# Patient Record
Sex: Female | Born: 1970 | Race: White | Hispanic: No | Marital: Married | Smoking: Former smoker
Health system: Southern US, Community
[De-identification: ages and names within clinical notes are randomized; demographics above are authoritative.]

## PROBLEM LIST (undated history)

## (undated) DIAGNOSIS — I471 Supraventricular tachycardia, unspecified: Secondary | ICD-10-CM

## (undated) DIAGNOSIS — Z8669 Personal history of other diseases of the nervous system and sense organs: Secondary | ICD-10-CM

## (undated) DIAGNOSIS — E119 Type 2 diabetes mellitus without complications: Secondary | ICD-10-CM

## (undated) DIAGNOSIS — M1990 Unspecified osteoarthritis, unspecified site: Secondary | ICD-10-CM

## (undated) DIAGNOSIS — T7840XA Allergy, unspecified, initial encounter: Secondary | ICD-10-CM

## (undated) HISTORY — DX: Type 2 diabetes mellitus without complications: E11.9

## (undated) HISTORY — DX: Allergy, unspecified, initial encounter: T78.40XA

## (undated) HISTORY — PX: DENTAL SURGERY: SHX609

## (undated) HISTORY — DX: Supraventricular tachycardia, unspecified: I47.10

## (undated) HISTORY — PX: MOUTH SURGERY: SHX715

---

## 2004-10-12 ENCOUNTER — Other Ambulatory Visit: Admission: RE | Admit: 2004-10-12 | Discharge: 2004-10-12 | Payer: Self-pay | Admitting: Obstetrics and Gynecology

## 2004-10-13 ENCOUNTER — Other Ambulatory Visit: Admission: RE | Admit: 2004-10-13 | Discharge: 2004-10-13 | Payer: Self-pay | Admitting: Obstetrics and Gynecology

## 2005-05-29 ENCOUNTER — Inpatient Hospital Stay (HOSPITAL_COMMUNITY): Admission: AD | Admit: 2005-05-29 | Discharge: 2005-06-01 | Payer: Self-pay | Admitting: Obstetrics and Gynecology

## 2007-05-13 ENCOUNTER — Encounter: Admission: RE | Admit: 2007-05-13 | Discharge: 2007-05-13 | Payer: Self-pay | Admitting: Obstetrics and Gynecology

## 2007-09-04 ENCOUNTER — Inpatient Hospital Stay (HOSPITAL_COMMUNITY): Admission: AD | Admit: 2007-09-04 | Discharge: 2007-09-04 | Payer: Self-pay | Admitting: Obstetrics and Gynecology

## 2007-10-01 ENCOUNTER — Inpatient Hospital Stay (HOSPITAL_COMMUNITY): Admission: AD | Admit: 2007-10-01 | Discharge: 2007-10-03 | Payer: Self-pay | Admitting: Obstetrics and Gynecology

## 2007-12-20 ENCOUNTER — Emergency Department (HOSPITAL_COMMUNITY): Admission: EM | Admit: 2007-12-20 | Discharge: 2007-12-20 | Payer: Self-pay | Admitting: Emergency Medicine

## 2010-09-19 NOTE — Discharge Summary (Signed)
Megan Austin, Megan Austin             ACCOUNT NO.:  [id number]   MEDICAL RECORD NO.:  [id number]          PATIENT TYPE:  INP   LOCATION:  9144                          FACILITY:  [facility]   PHYSICIAN:  Sherron Monday, MD        DATE OF BIRTH:  1970-12-15   DATE OF ADMISSION:  10/01/2007  DATE OF DISCHARGE:  10/03/2007                               DISCHARGE SUMMARY   ADMITTING DIAGNOSES:  Intrauterine pregnancy at term, spontaneous  rupture of membranes.   DISCHARGE DIAGNOSES:  Intrauterine pregnancy at term, spontaneous  rupture of membranes, delivered via spontaneous vaginal delivery.   HISTORY OF PRESENT ILLNESS:  A 40 year old, G3, P2-0-0-2 at 39 plus  weeks with spontaneous rupture of membranes at 8 p.m. for clear fluid.  She states she has had good fetal movements, no vaginal bleeding, and  regular contractions that are increasing in intensity and frequency.  She has had uncomplicated prenatal care except that she is advanced  maternal age and she has had gestational diabetes.   PAST MEDICAL HISTORY:  Nonsignificant.   PAST SURGICAL HISTORY:  Significant only for oral surgery.   PAST OB/GYN HISTORY:  G1 was a term vaginal delivery of female infant.  This pregnancy, she had gestational diabetes as well.  G2 was a term  vaginal delivery.  G3 is the present pregnancy complicated by  gestational diabetes as well as advanced maternal age.  No abnormal Pap  smears.  No sexually transmitted diseases.   MEDICATIONS:  Include glyburide 2.5 mg daily as well as a prenatal  vitamins.   ALLERGIES:  AMOXICILLIN, LORABID, SEPTRA, and KEFLEX.   SOCIAL HISTORY:  She is a half pack a day smoker.  No alcohol or other  drugs in the pregnancy.  She is married.   FAMILY HISTORY:  Significant for high blood pressure on mother, father,  brother, and grandparents.  Diabetes in paternal grandmother and thyroid  disease in her mother.   PRENATAL LABS:  Hemoglobin 13.5 and platelets 354,000, A positive,   MSU  negative,  gonorrhea negative, chlamydia negative, RPR nonreactive,  rubella immune, hepatitis C surface antigen negative, HIV negative,  group B strep negative, glucola 121.  Serial GTT 98, 184, 136, and 49.  An ultrasound revealed an EDC of 10/04/2007 at 19 weeks, normal anatomy  and anterior placenta.   On admission, she is afebrile with vital signs that were stable and a  benign exam.  Fetal heart tones are in 120s and reactive.  At this time,  her exam was 7-8 cm dilated, 90% effaced, and 0 station.  She was  admitted.  As she is group B strep negative, she was not given any  prophylaxis, expected to have spontaneous vaginal delivery, and the  patient desired an epidural, so we proceeded with this.  Her antepartum  course was rapid.  She progressed to complete, +2, pushed well to  deliver for approximately female infant at 2:28 with Apgars of 9 at 1  minute and 9 at 5 minutes and a weight of 2930 g.  Placenta was  extracted intact at 2:32.  Second  grade perineal laceration was repaired  in typical fashion with 3-0 Vicryl.  Estimated blood loss was less than  500 mL.  Her postpartum course was uncomplicated.  She was ambulating  well without complaints, a normal lochia and her pain was well  controlled.  On postpartum day  3, she was discharged to home with routine discharge instructions and  numbers to call with any questions or problems.  She is A positive,  rubella immune.  She wants to try an implant for birth control.  She  will bottle-feed and her hemoglobin decreased from 13.8 to 12.8.      Sherron Monday, MD  Electronically Signed     JB/MEDQ  D:  10/03/2007  T:  10/03/2007  Job:  [id number]

## 2011-01-31 LAB — CBC
HCT: 37
Hemoglobin: 12.8
Hemoglobin: 13
MCHC: 34.6
MCV: 94.2
MCV: 95.4
RBC: 3.86 — ABNORMAL LOW
RDW: 14.1
RDW: 14.4
WBC: 13.3 — ABNORMAL HIGH

## 2011-05-08 DIAGNOSIS — Z8669 Personal history of other diseases of the nervous system and sense organs: Secondary | ICD-10-CM

## 2011-05-08 HISTORY — DX: Personal history of other diseases of the nervous system and sense organs: Z86.69

## 2012-04-30 ENCOUNTER — Ambulatory Visit (INDEPENDENT_AMBULATORY_CARE_PROVIDER_SITE_OTHER): Payer: BC Managed Care – PPO | Admitting: Family Medicine

## 2012-04-30 VITALS — BP 169/111 | HR 102 | Temp 98.0°F | Resp 20 | Ht 64.0 in | Wt 177.0 lb

## 2012-04-30 DIAGNOSIS — J40 Bronchitis, not specified as acute or chronic: Secondary | ICD-10-CM

## 2012-04-30 DIAGNOSIS — J209 Acute bronchitis, unspecified: Secondary | ICD-10-CM

## 2012-04-30 MED ORDER — HYDROCODONE-HOMATROPINE 5-1.5 MG/5ML PO SYRP: 5.0000 mL | ORAL_SOLUTION | Freq: Three times a day (TID) | ORAL | Status: DC | PRN

## 2012-04-30 MED ORDER — AZITHROMYCIN 250 MG PO TABS: ORAL_TABLET | ORAL | Status: DC

## 2012-04-30 NOTE — Patient Instructions (Signed)

## 2012-04-30 NOTE — Progress Notes (Signed)
Megan Austin MRN: [id number], DOB: 1970-07-06, 41 y.o. Date of Encounter: 04/30/2012, 2:39 PM  Primary Physician: No primary provider on file.  Chief Complaint:  Chief Complaint  Patient presents with  . Cough    started Sunday- has been taking Robutussin CF and advil  . Nasal Congestion  . Wheezing    HPI: 41 y.o. year old female presents with a 3 day history of nasal congestion, post nasal drip, sore throat, and cough. Mild sinus pressure. Afebrile. No chills. Nasal congestion chronically Cough is productive of yellow sputum and not associated with time of day. Ears feel full, leading to sensation of muffled hearing. Has tried OTC cold preps without success. No GI complaints.   Patient has h/o allergy shots as child, but was healthy during 20's.  Quit smoking 2 years ago.  No sick contacts, recent antibiotics, or recent travels.   No leg trauma, sedentary periods, h/o cancer, or tobacco use.  Past Medical History  Diagnosis Date  . Allergy   . Diabetes mellitus without complication      Home Meds: Prior to Admission medications   Not on File    Allergies:  Allergies  Allergen Reactions  . Amoxicillin Hives  . Ceclor (Cefaclor) Hives  . Keflex (Cephalexin) Hives  . Lorabid (Loracarbef) Hives  . Septra (Sulfamethoxazole W-Trimethoprim) Hives    History   Social History  . Marital Status: Married    Spouse Name: N/A    Number of Children: N/A  . Years of Education: N/A   Occupational History  . Not on file.   Social History Main Topics  . Smoking status: Never Smoker   . Smokeless tobacco: Not on file  . Alcohol Use: No  . Drug Use: No  . Sexually Active: Not on file   Other Topics Concern  . Not on file   Social History Narrative  . No narrative on file     Review of Systems: Constitutional: negative for chills, fever, night sweats or weight changes Cardiovascular: negative for chest pain or palpitations Respiratory: negative for hemoptysis,  wheezing, or shortness of breath Abdominal: negative for abdominal pain, nausea, vomiting or diarrhea Dermatological: negative for rash Neurologic: negative for headache   Physical Exam: Blood pressure 169/111, pulse 102, temperature 98 F (36.7 C), temperature source Oral, resp. rate 20, height 5\' 4"  (1.626 m), weight 177 lb (80.287 kg), last menstrual period 03/26/2012, SpO2 99.00%., Body mass index is 30.38 kg/(m^2). General: Well developed, well nourished, in no acute distress. Head: Normocephalic, atraumatic, eyes without discharge, sclera non-icteric, nares are congested. Bilateral auditory canals clear, TM's are without perforation, pearly grey with reflective cone of light bilaterally. No sinus TTP. Oral cavity moist, dentition normal. Posterior pharynx with post nasal drip and mild erythema. No peritonsillar abscess or tonsillar exudate. Neck: Supple. No thyromegaly. Full ROM. No lymphadenopathy. Lungs: Coarse breath sounds bilaterally with wheezes, rales, or rhonchi. Breathing is unlabored.  Heart: RRR with S1 S2. No murmurs, rubs, or gallops appreciated. Msk:  Strength and tone normal for age. Extremities: No clubbing or cyanosis. No edema. Neuro: Alert and oriented X 3. Moves all extremities spontaneously. CNII-XII grossly in tact. Psych:  Responds to questions appropriately with a normal affect.    ASSESSMENT AND PLAN:  41 y.o. year old female with bronchitis. 1. Bronchitis  azithromycin (ZITHROMAX Z-PAK) 250 MG tablet, HYDROcodone-homatropine (HYCODAN) 5-1.5 MG/5ML syrup    - -Tylenol/Motrin prn -Rest/fluids -RTC precautions -RTC 3-5 days if no improvement  Signed, [name], MD  04/30/2012 2:39 PM

## 2013-05-27 ENCOUNTER — Ambulatory Visit (INDEPENDENT_AMBULATORY_CARE_PROVIDER_SITE_OTHER): Payer: BC Managed Care – PPO | Admitting: Physician Assistant

## 2013-05-27 VITALS — BP 128/80 | HR 109 | Temp 98.6°F | Resp 18 | Ht 63.5 in | Wt 177.0 lb

## 2013-05-27 DIAGNOSIS — R059 Cough, unspecified: Secondary | ICD-10-CM

## 2013-05-27 DIAGNOSIS — R05 Cough: Secondary | ICD-10-CM

## 2013-05-27 DIAGNOSIS — R5383 Other fatigue: Secondary | ICD-10-CM

## 2013-05-27 DIAGNOSIS — J40 Bronchitis, not specified as acute or chronic: Secondary | ICD-10-CM

## 2013-05-27 DIAGNOSIS — R5381 Other malaise: Secondary | ICD-10-CM

## 2013-05-27 LAB — POCT INFLUENZA A/B
Influenza A, POC: NEGATIVE
Influenza B, POC: NEGATIVE

## 2013-05-27 LAB — POCT CBC
GRANULOCYTE PERCENT: 68.3 % (ref 37–80)
HEMATOCRIT: 44.8 % (ref 37.7–47.9)
Hemoglobin: 13.8 g/dL (ref 12.2–16.2)
LYMPH, POC: 1.8 (ref 0.6–3.4)
MCH: 30.1 pg (ref 27–31.2)
MCHC: 30.8 g/dL — AB (ref 31.8–35.4)
MCV: 97.7 fL — AB (ref 80–97)
MID (CBC): 0.6 (ref 0–0.9)
MPV: 8.8 fL (ref 0–99.8)
PLATELET COUNT, POC: 331 10*3/uL (ref 142–424)
POC Granulocyte: 5.2 (ref 2–6.9)
POC LYMPH %: 23.4 % (ref 10–50)
POC MID %: 8.3 %M (ref 0–12)
RBC: 4.59 M/uL (ref 4.04–5.48)
RDW, POC: 14.1 %
WBC: 7.6 10*3/uL (ref 4.6–10.2)

## 2013-05-27 MED ORDER — HYDROCODONE-HOMATROPINE 5-1.5 MG/5ML PO SYRP: ORAL_SOLUTION | ORAL | Status: DC

## 2013-05-27 MED ORDER — AZITHROMYCIN 250 MG PO TABS: ORAL_TABLET | ORAL | Status: DC

## 2013-05-27 MED ORDER — PREDNISONE 20 MG PO TABS: ORAL_TABLET | ORAL | Status: DC

## 2013-05-27 NOTE — Progress Notes (Signed)
Subjective:    Patient ID: Megan Austin, female    DOB: 05/20/1970, 43 y.o.   MRN: [id number]  HPI Primary Physician: No primary provider on file.  Chief Complaint: URI x 2 days  HPI: 43 y.o. female with history below presents with 2 day history of nasal congestion, clogged ears, cough, myalgias, headache, and fatigue. Afebrile. No chills. Cough is mostly dry, but has been productive of some clear sputum in the mornings when she wakes up. No SOB or wheezing. Feels like there is some post nasal drip and inflammation that is causing her cough. She describes this as a tickle. She will get into coughing and sneezing episodes that last several minutes before they self resolve. She is sore from coughing. No difficulty swallowing. No true sore throat. Bilateral ears feel full causing a sense of muffled hearing. No otalgia. Multiple sick contacts at work, as she works at a nursing care facility. No one in her building has had the flu she reports, however at the next building over there have been some flu cases and some of the employees do rotate between buildings. No GI issues. Has been taking cough drops and Advil. Last took an antipyretic this morning, 600 mg of Advil. She did not get an influenza vaccine this year.    Past Medical History  Diagnosis Date  . Allergy   . Diabetes mellitus without complication      Home Meds: Prior to Admission medications   Not on File    Allergies:  Allergies  Allergen Reactions  . Amoxicillin Hives  . Ceclor [Cefaclor] Hives  . Keflex [Cephalexin] Hives  . Lorabid [Loracarbef] Hives  . Septra [Sulfamethoxazole-Trimethoprim] Hives    History   Social History  . Marital Status: Married    Spouse Name: N/A    Number of Children: N/A  . Years of Education: N/A   Occupational History  . Not on file.   Social History Main Topics  . Smoking status: Never Smoker   . Smokeless tobacco: Not on file  . Alcohol Use: No  . Drug Use: No  . Sexual  Activity: Not on file   Other Topics Concern  . Not on file   Social History Narrative  . No narrative on file     Review of Systems  Constitutional: Positive for fatigue. Negative for fever and chills.  HENT: Positive for congestion, hearing loss, postnasal drip, sinus pressure and sneezing. Negative for rhinorrhea and sore throat.        Losing her voice  Respiratory: Positive for cough. Negative for shortness of breath and wheezing.        Cough is mostly dry.  Cough is worse in the morning, more nagging in the day "like a tickle" in her throat.  Gastrointestinal: Negative for nausea, vomiting and diarrhea.  Musculoskeletal: Positive for myalgias.  Neurological: Positive for headaches.       Sinus headache       Objective:   Physical Exam  Physical Exam: Blood pressure 128/80, pulse 109, temperature 98.6 F (37 C), temperature source Oral, resp. rate 18, height 5' 3.5" (1.613 m), weight 177 lb (80.287 kg), last menstrual period 05/20/2013, SpO2 98.00%., Body mass index is 30.86 kg/(m^2). General: Well developed, well nourished, in no acute distress. Head: Normocephalic, atraumatic, eyes without discharge, sclera non-icteric, nares are congested. Bilateral auditory canals clear, TM's are without perforation, pearly grey with reflective cone of light bilaterally. No sinus TTP. Oral cavity moist, dentition normal. Posterior  pharynx with post nasal drip and mild erythema. No peritonsillar abscess or tonsillar exudate. Uvula midline.  Neck: Supple. No thyromegaly. Full ROM. No lymphadenopathy. Lungs: Clear to auscultation bilaterally without wheezes, rales, or rhonchi. Breathing is unlabored.  Heart: RRR with S1 S2. No murmurs, rubs, or gallops appreciated. Msk:  Strength and tone normal for age. Extremities: No clubbing or cyanosis. No edema. Neuro: Alert and oriented X 3. Moves all extremities spontaneously. CNII-XII grossly in tact. Psych:  Responds to questions appropriately  with a normal affect.   Labs: Results for orders placed in visit on 05/27/13  POCT CBC      Result Value Range   WBC 7.6  4.6 - 10.2 K/uL   Lymph, poc 1.8  0.6 - 3.4   POC LYMPH PERCENT 23.4  10 - 50 %L   MID (cbc) 0.6  0 - 0.9   POC MID % 8.3  0 - 12 %M   POC Granulocyte 5.2  2 - 6.9   Granulocyte percent 68.3  37 - 80 %G   RBC 4.59  4.04 - 5.48 M/uL   Hemoglobin 13.8  12.2 - 16.2 g/dL   HCT, POC 96.044.8  45.437.7 - 47.9 %   MCV 97.7 (*) 80 - 97 fL   MCH, POC 30.1  27 - 31.2 pg   MCHC 30.8 (*) 31.8 - 35.4 g/dL   RDW, POC 09.814.1     Platelet Count, POC 331  142 - 424 K/uL   MPV 8.8  0 - 99.8 fL  POCT INFLUENZA A/B      Result Value Range   Influenza A, POC Negative     Influenza B, POC Negative         Assessment & Plan:  43 year old female with bronchitis and cough -Azithromycin 250 MG #6 2 po first day then 1 po next 4 days no RF -Prednisone 20 mg #12 3x2, 2x2, 1x2 no RF, SED -Hycodan #4oz 1 tsp po q 4-6 hours prn cough no RF, SED -Tylenol/Motrin -Rest/fluids -RTC precautions   Eula Listenyan Daemion Mcniel, MHS, PA-C [facility] [address] [place], [place] [id number] [phone] [facility] 05/27/2013 11:28 AM

## 2014-01-09 ENCOUNTER — Ambulatory Visit (INDEPENDENT_AMBULATORY_CARE_PROVIDER_SITE_OTHER): Payer: BC Managed Care – PPO | Admitting: Emergency Medicine

## 2014-01-09 VITALS — HR 89 | Temp 98.1°F | Resp 16 | Ht 64.0 in | Wt 179.2 lb

## 2014-01-09 DIAGNOSIS — R05 Cough: Secondary | ICD-10-CM

## 2014-01-09 DIAGNOSIS — R059 Cough, unspecified: Secondary | ICD-10-CM

## 2014-01-09 MED ORDER — HYDROCODONE-HOMATROPINE 5-1.5 MG/5ML PO SYRP: 5.0000 mL | ORAL_SOLUTION | Freq: Three times a day (TID) | ORAL | Status: DC | PRN

## 2014-01-09 MED ORDER — ALBUTEROL SULFATE HFA 108 (90 BASE) MCG/ACT IN AERS: 2.0000 | INHALATION_SPRAY | RESPIRATORY_TRACT | Status: DC | PRN

## 2014-01-09 MED ORDER — ALBUTEROL SULFATE (2.5 MG/3ML) 0.083% IN NEBU
2.5000 mg | INHALATION_SOLUTION | Freq: Once | RESPIRATORY_TRACT | Status: AC
  Administered 2014-01-09: 2.5 mg via RESPIRATORY_TRACT

## 2014-01-09 MED ORDER — AZITHROMYCIN 250 MG PO TABS: ORAL_TABLET | ORAL | Status: DC

## 2014-01-09 NOTE — Progress Notes (Signed)
Subjective:    Patient ID: Megan Austin, female    DOB: October 25, 1970, 43 y.o.   MRN: [id number]  This chart was scribed for Lesle Chris, MD by Jarvis Morgan, Medical Scribe. This patient was seen in Room 3 and the patient's care was started at 1:14 PM.  Chief Complaint  Patient presents with  . Cough    sxs started a few days ago; yellow-brown phelgm;  cough more at night OTC cold meds without any relief  . Headache    HPI HPI Comments: Megan Austin is a 43 y.o. female with a h/o allergies and DM w/o complication who presents to the [facility] complaining of an intermittent, moderate, gradually worsening, cough, productive of brown and yellow sputum for 3 days. She believes she may have an URI or bronchitis. She states her youngest son had a URI last week. She states she is having associated wheezing, congestion, chest tightness and HA.  Pt reports that the cough is worse at night and in the morning. She is a former smoker and admits she quit 4 years ago. She admits she used her sons inhaler which provided moderate relief. Never been on inhalers in the past. She used to get frequent URIs as a child. No h/o asthma. She denies taken any OTC medication because she feels like it does not work very well for her symptoms. She denies any fever or chills.  There are no active problems to display for this patient.  Past Medical History  Diagnosis Date  . Allergy   . Diabetes mellitus without complication    History reviewed. No pertinent past surgical history. Allergies  Allergen Reactions  . Amoxicillin Hives  . Ceclor [Cefaclor] Hives  . Keflex [Cephalexin] Hives  . Lorabid [Loracarbef] Hives  . Septra [Sulfamethoxazole-Trimethoprim] Hives   Prior to Admission medications   Not on File   History   Social History  . Marital Status: Married    Spouse Name: N/A    Number of Children: N/A  . Years of Education: N/A   Occupational History  . Not on file.    Social History Main Topics  . Smoking status: Never Smoker   . Smokeless tobacco: Not on file  . Alcohol Use: No  . Drug Use: No  . Sexual Activity: Not on file   Other Topics Concern  . Not on file   Social History Narrative  . No narrative on file      Review of Systems  Constitutional: Negative for fever and chills.  HENT: Positive for congestion.   Respiratory: Positive for cough, chest tightness and wheezing.   Cardiovascular: Negative for chest pain.  Neurological: Positive for headaches.       Objective:   Physical Exam CONSTITUTIONAL: Well developed/well nourished HEAD: Normocephalic/atraumatic EYES: EOMI/PERRL ENMT: Mucous membranes moist NECK: supple no meningeal signs SPINE:entire spine nontender CV: S1/S2 noted, no murmurs/rubs/gallops noted LUNGS: A few rhonchi to left anterior chest. Good breath sounds. Good air exchange. ABDOMEN: soft, nontender, no rebound or guarding GU:no cva tenderness NEURO: Pt is awake/alert, moves all extremitiesx4 EXTREMITIES: pulses normal, full ROM SKIN: warm, color normal PSYCH: no abnormalities of mood noted  Meds ordered this encounter  Medications  . albuterol (PROVENTIL) (2.5 MG/3ML) 0.083% nebulizer solution 2.5 mg    Sig:   . azithromycin (ZITHROMAX) 250 MG tablet    Sig: Take 2 tabs PO x 1 dose, then 1 tab PO QD x 4 days  Dispense:  6 tablet    Refill:  0  . albuterol (PROVENTIL HFA;VENTOLIN HFA) 108 (90 BASE) MCG/ACT inhaler    Sig: Inhale 2 puffs into the lungs every 4 (four) hours as needed for wheezing or shortness of breath (cough, shortness of breath or wheezing.).    Dispense:  1 Inhaler    Refill:  1  . HYDROcodone-homatropine (HYCODAN) 5-1.5 MG/5ML syrup    Sig: Take 5 mLs by mouth every 8 (eight) hours as needed for cough.    Dispense:  120 mL    Refill:  0       Assessment & Plan:  Treat with Zithromax Hycodan and albuterol inhaler recheck if not better in the next 48-72 hours. I  personally performed the services described in this documentation, which was scribed in my presence. The recorded information has been reviewed and is accurate.

## 2014-01-09 NOTE — Patient Instructions (Signed)

## 2014-06-18 ENCOUNTER — Telehealth: Payer: Self-pay

## 2014-06-18 ENCOUNTER — Ambulatory Visit (INDEPENDENT_AMBULATORY_CARE_PROVIDER_SITE_OTHER): Payer: BLUE CROSS/BLUE SHIELD | Admitting: Internal Medicine

## 2014-06-18 VITALS — BP 120/82 | HR 114 | Temp 98.6°F | Resp 16 | Ht 64.0 in | Wt 181.6 lb

## 2014-06-18 DIAGNOSIS — H65191 Other acute nonsuppurative otitis media, right ear: Secondary | ICD-10-CM

## 2014-06-18 DIAGNOSIS — J209 Acute bronchitis, unspecified: Secondary | ICD-10-CM

## 2014-06-18 DIAGNOSIS — J029 Acute pharyngitis, unspecified: Secondary | ICD-10-CM

## 2014-06-18 DIAGNOSIS — R059 Cough, unspecified: Secondary | ICD-10-CM

## 2014-06-18 DIAGNOSIS — R05 Cough: Secondary | ICD-10-CM

## 2014-06-18 LAB — POCT INFLUENZA A/B
Influenza A, POC: NEGATIVE
Influenza B, POC: NEGATIVE

## 2014-06-18 MED ORDER — AZITHROMYCIN 500 MG PO TABS: 500.0000 mg | ORAL_TABLET | Freq: Every day | ORAL | Status: DC

## 2014-06-18 MED ORDER — BENZONATATE 200 MG PO CAPS: 200.0000 mg | ORAL_CAPSULE | Freq: Two times a day (BID) | ORAL | Status: DC | PRN

## 2014-06-18 MED ORDER — HYDROCODONE-HOMATROPINE 5-1.5 MG/5ML PO SYRP: 5.0000 mL | ORAL_SOLUTION | Freq: Three times a day (TID) | ORAL | Status: DC | PRN

## 2014-06-18 NOTE — Progress Notes (Signed)
   Subjective:    Patient ID: Megan Austin, female    DOB: March 09, 1971, 44 y.o.   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    Review of Systems  Constitutional: Positive for fever, chills, diaphoresis and fatigue.  HENT: Positive for congestion, ear pain, rhinorrhea and sinus pressure.   Eyes: Negative.   Respiratory: Positive for cough.   Cardiovascular: Negative.   Gastrointestinal: Negative.   Endocrine: Negative.   Genitourinary: Negative.   Musculoskeletal: Negative.   Skin: Negative.   Allergic/Immunologic: Negative.   Neurological: Negative.   Hematological: Negative.   Psychiatric/Behavioral: Negative.   All other systems reviewed and are negative.      Objective:   Physical Exam  Constitutional: She is oriented to person, place, and time. She appears well-developed and well-nourished.  HENT:  Head: Normocephalic and atraumatic.  Left Ear: External ear normal.  Right ear bulging membrane, dullness to light Pharynx injected no exudate Nasal congestion   Eyes:  Conjunctival injection  Neck: No tracheal deviation present. No thyromegaly present.  Cardiovascular: Normal rate, regular rhythm, normal heart sounds and intact distal pulses.   Pulmonary/Chest: Effort normal. No respiratory distress.  Coarse rhonchi in bases  Abdominal: Soft. Bowel sounds are normal.  Musculoskeletal: Normal range of motion.  Lymphadenopathy:    She has cervical adenopathy.  Neurological: She is alert and oriented to person, place, and time.  Skin: Skin is warm and dry.  Psychiatric: She has a normal mood and affect. Her behavior is normal. Judgment and thought content normal.  Nursing note and vitals reviewed.    Results for orders  placed or performed in visit on 05/27/13  POCT CBC  Result Value Ref Range   WBC 7.6 4.6 - 10.2 K/uL   Lymph, poc 1.8 0.6 - 3.4   POC LYMPH PERCENT 23.4 10 - 50 %L   MID (cbc) 0.6 0 - 0.9   POC MID % 8.3 0 - 12 %M   POC Granulocyte 5.2 2 - 6.9   Granulocyte percent 68.3 37 - 80 %G   RBC 4.59 4.04 - 5.48 M/uL   Hemoglobin 13.8 12.2 - 16.2 g/dL   HCT, POC 65.744.8 84.637.7 - 47.9 %   MCV 97.7 (A) 80 - 97 fL   MCH, POC 30.1 27 - 31.2 pg   MCHC 30.8 (A) 31.8 - 35.4 g/dL   RDW, POC 96.214.1 %   Platelet Count, POC 331 142 - 424 K/uL   MPV 8.8 0 - 99.8 fL  POCT Influenza A/B  Result Value Ref Range   Influenza A, POC Negative    Influenza B, POC Negative        Results for orders placed or performed in visit on 06/18/14  POCT Influenza A/B  Result Value Ref Range   Influenza A, POC Negative    Influenza B, POC Negative    Assessment & Plan:  44 year old female with cough earache sore throat and sinus drainage, myalgias. Te influenza swab is negitive. Will rx with zithromax and antitussive tessalon perle and nsai for aches. Use the hycodan at bedtime for cough.To follow up in 2 days if worsening of symptoms or no improvement.

## 2014-06-18 NOTE — Telephone Encounter (Signed)
Dr. Mindi JunkerGottlieb would like pt to be scheduled at the appt center to establish care. Any provider is fine but we really need her to have a PCP.

## 2014-06-18 NOTE — Patient Instructions (Addendum)
Take the zithromax as directed . Tessalon perle for cough. Ibuprofen for aches and arthralgias as directed. Increase fluids.Return  if no improvement in 2 days or if worsening of your symptoms. Hycodan at night for cough.

## 2014-06-25 NOTE — Telephone Encounter (Signed)
Left message for patient to call back to set up pcp.

## 2015-05-08 ENCOUNTER — Ambulatory Visit (INDEPENDENT_AMBULATORY_CARE_PROVIDER_SITE_OTHER): Payer: BLUE CROSS/BLUE SHIELD | Admitting: Urgent Care

## 2015-05-08 VITALS — BP 140/90 | HR 103 | Temp 98.2°F | Resp 16 | Ht 64.0 in | Wt 181.0 lb

## 2015-05-08 DIAGNOSIS — R0981 Nasal congestion: Secondary | ICD-10-CM | POA: Diagnosis not present

## 2015-05-08 DIAGNOSIS — J029 Acute pharyngitis, unspecified: Secondary | ICD-10-CM | POA: Diagnosis not present

## 2015-05-08 DIAGNOSIS — R05 Cough: Secondary | ICD-10-CM | POA: Diagnosis not present

## 2015-05-08 DIAGNOSIS — R059 Cough, unspecified: Secondary | ICD-10-CM

## 2015-05-08 DIAGNOSIS — J302 Other seasonal allergic rhinitis: Secondary | ICD-10-CM | POA: Diagnosis not present

## 2015-05-08 DIAGNOSIS — J329 Chronic sinusitis, unspecified: Secondary | ICD-10-CM

## 2015-05-08 DIAGNOSIS — J069 Acute upper respiratory infection, unspecified: Secondary | ICD-10-CM | POA: Diagnosis not present

## 2015-05-08 DIAGNOSIS — R0982 Postnasal drip: Secondary | ICD-10-CM

## 2015-05-08 MED ORDER — CETIRIZINE HCL 10 MG PO TABS: 10.0000 mg | ORAL_TABLET | Freq: Every day | ORAL | Status: DC

## 2015-05-08 MED ORDER — PSEUDOEPHEDRINE HCL ER 120 MG PO TB12: 120.0000 mg | ORAL_TABLET | Freq: Two times a day (BID) | ORAL | Status: DC

## 2015-05-08 MED ORDER — HYDROCODONE-HOMATROPINE 5-1.5 MG/5ML PO SYRP: 5.0000 mL | ORAL_SOLUTION | Freq: Three times a day (TID) | ORAL | Status: DC | PRN

## 2015-05-08 NOTE — Patient Instructions (Signed)
Upper Respiratory Infection, Adult Most upper respiratory infections (URIs) are a viral infection of the air passages leading to the lungs. A URI affects the nose, throat, and upper air passages. The most common type of URI is nasopharyngitis and is typically referred to as "the common cold." URIs run their course and usually go away on their own. Most of the time, a URI does not require medical attention, but sometimes a bacterial infection in the upper airways can follow a viral infection. This is called a secondary infection. Sinus and middle ear infections are common types of secondary upper respiratory infections. Bacterial pneumonia can also complicate a URI. A URI can worsen asthma and chronic obstructive pulmonary disease (COPD). Sometimes, these complications can require emergency medical care and may be life threatening.  CAUSES Almost all URIs are caused by viruses. A virus is a type of germ and can spread from one person to another.  RISKS FACTORS You may be at risk for a URI if:   You smoke.   You have chronic heart or lung disease.  You have a weakened defense (immune) system.   You are very young or very old.   You have nasal allergies or asthma.  You work in crowded or poorly ventilated areas.  You work in health care facilities or schools. SIGNS AND SYMPTOMS  Symptoms typically develop 2-3 days after you come in contact with a cold virus. Most viral URIs last 7-10 days. However, viral URIs from the influenza virus (flu virus) can last 14-18 days and are typically more severe. Symptoms may include:   Runny or stuffy (congested) nose.   Sneezing.   Cough.   Sore throat.   Headache.   Fatigue.   Fever.   Loss of appetite.   Pain in your forehead, behind your eyes, and over your cheekbones (sinus pain).  Muscle aches.  DIAGNOSIS  Your health care provider may diagnose a URI by:  Physical exam.  Tests to check that your symptoms are not due to  another condition such as:  Strep throat.  Sinusitis.  Pneumonia.  Asthma. TREATMENT  A URI goes away on its own with time. It cannot be cured with medicines, but medicines may be prescribed or recommended to relieve symptoms. Medicines may help:  Reduce your fever.  Reduce your cough.  Relieve nasal congestion. HOME CARE INSTRUCTIONS   Take medicines only as directed by your health care provider.   Gargle warm saltwater or take cough drops to comfort your throat as directed by your health care provider.  Use a warm mist humidifier or inhale steam from a shower to increase air moisture. This may make it easier to breathe.  Drink enough fluid to keep your urine clear or pale yellow.   Eat soups and other clear broths and maintain good nutrition.   Rest as needed.   Return to work when your temperature has returned to normal or as your health care provider advises. You may need to stay home longer to avoid infecting others. You can also use a face mask and careful hand washing to prevent spread of the virus.  Increase the usage of your inhaler if you have asthma.   Do not use any tobacco products, including cigarettes, chewing tobacco, or electronic cigarettes. If you need help quitting, ask your health care provider. PREVENTION  The best way to protect yourself from getting a cold is to practice good hygiene.   Avoid oral or hand contact with people with cold   symptoms.   Wash your hands often if contact occurs.  There is no clear evidence that vitamin C, vitamin E, echinacea, or exercise reduces the chance of developing a cold. However, it is always recommended to get plenty of rest, exercise, and practice good nutrition.  SEEK MEDICAL CARE IF:   You are getting worse rather than better.   Your symptoms are not controlled by medicine.   You have chills.  You have worsening shortness of breath.  You have brown or red mucus.  You have yellow or brown nasal  discharge.  You have pain in your face, especially when you bend forward.  You have a fever.  You have swollen neck glands.  You have pain while swallowing.  You have white areas in the back of your throat. SEEK IMMEDIATE MEDICAL CARE IF:   You have severe or persistent:  Headache.  Ear pain.  Sinus pain.  Chest pain.  You have chronic lung disease and any of the following:  Wheezing.  Prolonged cough.  Coughing up blood.  A change in your usual mucus.  You have a stiff neck.  You have changes in your:  Vision.  Hearing.  Thinking.  Mood. MAKE SURE YOU:   Understand these instructions.  Will watch your condition.  Will get help right away if you are not doing well or get worse.   This information is not intended to replace advice given to you by your health care provider. Make sure you discuss any questions you have with your health care provider.   Document Released: 10/17/2000 Document Revised: 09/07/2014 Document Reviewed: 07/29/2013 Elsevier Interactive Patient Education 2016 Elsevier Inc.  

## 2015-05-08 NOTE — Progress Notes (Signed)
    MRN: [id number] DOB: 07-Dec-1970  Subjective:   Megan Austin is a 45 y.o. female presenting for chief complaint of Sinus Congestion; Sore Throat; and Cough  Reports 2 day history of subjective fever, sinus congestion, sinus pressure, dry cough worse at night, sore throat. Has tried Benadryl, Dimetapp, Coriciden with some relief. Has a history of persistent allergies, does not take anything consistently for this, allergies are worst in Spring and Fall. Quit smoking 5 years ago. Denies chest pain, shob, wheezing, n/v, abdominal pain, sinus pain, ear pain, ear drainage, itchy or red eyes.   Megan Austin currently has no medications in their medication list. Also is allergic to amoxicillin; ceclor; keflex; lorabid; and septra.  Megan Austin  has a past medical history of Allergy and Diabetes mellitus without complication (HCC). Also  has no past surgical history on file.  Objective:   Vitals: BP 140/90 mmHg  Pulse 103  Temp(Src) 98.2 F (36.8 C) (Oral)  Resp 16  Ht 5\' 4"  (1.626 m)  Wt 181 lb (82.101 kg)  BMI 31.05 kg/m2  SpO2 99%  LMP 04/19/2015  Physical Exam  Constitutional: She is oriented to person, place, and time. She appears well-developed and well-nourished.  HENT:  TM's intact bilaterally, no effusions or erythema. Nasal turbinates erythematous. Mild bilateral maxillary sinus tenderness. Postnasal drip present, without oropharyngeal exudates, erythema or abscesses.  Eyes: Right eye exhibits no discharge. Left eye exhibits no discharge. No scleral icterus.  Neck: Normal range of motion. Neck supple.  Cardiovascular: Normal rate, regular rhythm and intact distal pulses.  Exam reveals no gallop and no friction rub.   No murmur heard. Pulmonary/Chest: No respiratory distress. She has no wheezes. She has no rales.  Lymphadenopathy:    She has no cervical adenopathy.  Neurological: She is alert and oriented to person, place, and time.  Skin: Skin is warm and dry. No rash noted. No  erythema. No pallor.   Assessment and Plan :   1. Seasonal allergies - Recommended patient start Zyrtec for better control of allergies, offered Singulair if allergies persist.  2. Upper respiratory infection 3. Cough 4. Sore throat 5. Nasal congestion 6. Post-nasal drainage - Likely undergoing viral syndrome worsened by uncontrolled allergies. Start [place]codan, Sudafed for symptom relief. - RTC in 1 week if no improvement.  Wallis BambergMario Talal Fritchman, PA-C [facility] [phone] 05/08/2015 10:49 AM

## 2015-05-13 ENCOUNTER — Telehealth: Payer: Self-pay

## 2015-05-13 DIAGNOSIS — R059 Cough, unspecified: Secondary | ICD-10-CM

## 2015-05-13 DIAGNOSIS — J029 Acute pharyngitis, unspecified: Secondary | ICD-10-CM

## 2015-05-13 DIAGNOSIS — R05 Cough: Secondary | ICD-10-CM

## 2015-05-13 NOTE — Telephone Encounter (Signed)
.   Seasonal allergies - Recommended patient start Zyrtec for better control of allergies, offered Singulair if allergies persist.  2. Upper respiratory infection 3. Cough 4. Sore throat 5. Nasal congestion 6. Post-nasal drainage - Likely undergoing viral syndrome worsened by uncontrolled allergies. Start [place] Hills[place]codan, Sudafed for symptom relief. - RTC in 1 week if no improvement.

## 2015-05-13 NOTE — Telephone Encounter (Signed)
[facility] Upper [facility] - Primary. She would like a cough syrup that needs a prescription-she states she's still having a lot of drainage. Pharmacy:  [id number] [address]- [place], [place] - [address] CB # [phone]

## 2015-05-13 NOTE — Telephone Encounter (Signed)
She was given hycodan - has that not helped?  She should add mucinex which will help with the congestion

## 2015-05-16 MED ORDER — HYDROCODONE-HOMATROPINE 5-1.5 MG/5ML PO SYRP: 5.0000 mL | ORAL_SOLUTION | Freq: Three times a day (TID) | ORAL | Status: DC | PRN

## 2015-05-16 MED ORDER — MONTELUKAST SODIUM 10 MG PO TABS: 10.0000 mg | ORAL_TABLET | Freq: Every day | ORAL | Status: DC

## 2015-05-16 NOTE — Telephone Encounter (Signed)
SPoke with pt, advised Rx at the pharmacy.

## 2015-05-16 NOTE — Telephone Encounter (Signed)
I will refill her Hycodan but also send a script for Singulair for better control of her allergies to take together with Zyrtec once daily. If she does not have improvement in the next 5 days or so, she needs to come back for re-evaluation. Please let her know.

## 2015-05-16 NOTE — Telephone Encounter (Signed)
Spoke with pt, she states she is taking mucinex but she would like a refill on the Hycodan. She is still coughing and it gets worse at night. Please advise.

## 2015-11-19 ENCOUNTER — Ambulatory Visit (INDEPENDENT_AMBULATORY_CARE_PROVIDER_SITE_OTHER): Payer: Managed Care, Other (non HMO) | Admitting: Family Medicine

## 2015-11-19 VITALS — BP 120/80 | HR 100 | Temp 98.5°F | Resp 12 | Ht 64.0 in | Wt 176.0 lb

## 2015-11-19 DIAGNOSIS — R300 Dysuria: Secondary | ICD-10-CM

## 2015-11-19 DIAGNOSIS — B029 Zoster without complications: Secondary | ICD-10-CM | POA: Diagnosis not present

## 2015-11-19 LAB — POCT URINALYSIS DIP (MANUAL ENTRY)
BILIRUBIN UA: NEGATIVE
Bilirubin, UA: NEGATIVE
GLUCOSE UA: NEGATIVE
Nitrite, UA: NEGATIVE
Spec Grav, UA: 1.025
Urobilinogen, UA: 0.2
pH, UA: 6

## 2015-11-19 LAB — POC MICROSCOPIC URINALYSIS (UMFC)

## 2015-11-19 MED ORDER — VALACYCLOVIR HCL 1 G PO TABS
1000.0000 mg | ORAL_TABLET | Freq: Three times a day (TID) | ORAL | Status: DC
Start: 2015-11-19 — End: 2022-12-31

## 2015-11-19 MED ORDER — CIPROFLOXACIN HCL 500 MG PO TABS: 500.0000 mg | ORAL_TABLET | Freq: Two times a day (BID) | ORAL | Status: DC

## 2015-11-19 NOTE — Progress Notes (Signed)
Megan Austin is a 45 y.o. female who presents to [facility] today for urinary urgency frequency and dysuria. Symptoms present for 3 days. Patient has not tried any medications yet. She notes she recently was exposed to clindamycin and clotrimazole for a tooth abscess with subsequent oral thrush. She has not tried any medications for her urinary symptoms. She notes her urinary symptoms are consistent with previous episodes of UTI. She denies any fevers or chills nausea vomiting or diarrhea.  Additionally she notes a rash on her back. She notes this is itchy and slightly painful. She thinks perhaps she was bitten by a spider. This also has been present for a few days. She has not tried any treatment yet.   Past Medical History  Diagnosis Date  . Allergy   . Diabetes mellitus without complication (HCC)    No past surgical history on file. Social History  Substance Use Topics  . Smoking status: Never Smoker   . Smokeless tobacco: Not on file  . Alcohol Use: No   ROS as above Medications: Current Outpatient Prescriptions  Medication Sig Dispense Refill  . cetirizine (ZYRTEC) 10 MG tablet Take 1 tablet (10 mg total) by mouth daily. (Patient not taking: Reported on 11/19/2015) 30 tablet 11  . ciprofloxacin (CIPRO) 500 MG tablet Take 1 tablet (500 mg total) by mouth 2 (two) times daily. 14 tablet 0  . HYDROcodone-homatropine (HYCODAN) 5-1.5 MG/5ML syrup Take 5 mLs by mouth every 8 (eight) hours as needed for cough. (Patient not taking: Reported on 11/19/2015) 120 mL 0  . montelukast (SINGULAIR) 10 MG tablet Take 1 tablet (10 mg total) by mouth at bedtime. (Patient not taking: Reported on 11/19/2015) 30 tablet 3  . pseudoephedrine (SUDAFED 12 HOUR) 120 MG 12 hr tablet Take 1 tablet (120 mg total) by mouth 2 (two) times daily. (Patient not taking: Reported on 11/19/2015) 30 tablet 3  . valACYclovir (VALTREX) 1000 MG tablet Take 1 tablet (1,000 mg total) by mouth 3 (three) times daily. 21 tablet 0    No current facility-administered medications for this visit.   Allergies  Allergen Reactions  . Amoxicillin Hives  . Ceclor [Cefaclor] Hives  . Keflex [Cephalexin] Hives  . Lorabid [Loracarbef] Hives  . Septra [Sulfamethoxazole-Trimethoprim] Hives     Exam:  BP 120/80 mmHg  Pulse 100  Temp(Src) 98.5 F (36.9 C) (Oral)  Resp 12  Ht  (1.626 m)  Wt 176 lb (79.833 kg)  BMI 30.20 kg/m2  SpO2 97%  LMP 11/12/2015 Gen: Well NAD HEENT: EOMI,  MMM Lungs: Normal work of breathing. CTABL Heart: RRR no MRG Abd: NABS, Soft. Nondistended, Nontender No CVA angle tenderness to percussion Exts: Brisk capillary refill, warm and well perfused.  Skin: Erythematous vesicular rash on the right flank. Nontender. No fluctuance or induration present.  Results for orders placed or performed in visit on 11/19/15 (from the past 24 hour(s))  POCT urinalysis dipstick     Status: Abnormal   Collection Time: 11/19/15 10:39 AM  Result Value Ref Range   Color, UA yellow yellow   Clarity, UA cloudy (A) clear   Glucose, UA negative negative   Bilirubin, UA negative negative   Ketones, POC UA negative negative   Spec Grav, UA 1.025    Blood, UA large (A) negative   pH, UA 6.0    Protein Ur, POC =30 (A) negative   Urobilinogen, UA 0.2    Nitrite, UA Negative Negative   Leukocytes, UA large (3+) (A)  Negative  POCT Microscopic Urinalysis ([facility])     Status: Abnormal   Collection Time: 11/19/15 10:39 AM  Result Value Ref Range   WBC,UR,HPF,POC Too numerous to count  (A) None WBC/hpf   RBC,UR,HPF,POC Too numerous to count  (A) None RBC/hpf   Bacteria Few (A) None, Too numerous to count   Mucus Present (A) Absent   Epithelial Cells, UR Per Microscopy Few (A) None, Too numerous to count cells/hpf   No results found.  Assessment and Plan: 45 y.o. female with  1) urinary tract infection: Culture pending. Empiric treatment with Cipro. Antibody choices limited due to multiple drug allergies.  Recommend also AZO as needed.  2) rash: I suspect this is shingles based on the appearance of the rash. Empiric treatment with Valtrex 1 g 3 times daily. Patient has no history of kidney disease therefore this dose should be appropriate.  Return as needed.  Discussed warning signs or symptoms. Please see discharge instructions. Patient expresses understanding.

## 2015-11-19 NOTE — Patient Instructions (Addendum)
Thank you for coming in today. I think you have a UTI.  Take cipro twice daily.  Use over the counter AZO as needed.   I think you also have shingles.  Take valtrex three times daily for 1 week.   Urinary Tract Infection Urinary tract infections (UTIs) can develop anywhere along your urinary tract. Your urinary tract is your body's drainage system for removing wastes and extra water. Your urinary tract includes two kidneys, two ureters, a bladder, and a urethra. Your kidneys are a pair of bean-shaped organs. Each kidney is about the size of your fist. They are located below your ribs, one on each side of your spine. CAUSES Infections are caused by microbes, which are microscopic organisms, including fungi, viruses, and bacteria. These organisms are so small that they can only be seen through a microscope. Bacteria are the microbes that most commonly cause UTIs. SYMPTOMS  Symptoms of UTIs may vary by age and gender of the patient and by the location of the infection. Symptoms in young women typically include a frequent and intense urge to urinate and a painful, burning feeling in the bladder or urethra during urination. Older women and men are more likely to be tired, shaky, and weak and have muscle aches and abdominal pain. A fever may mean the infection is in your kidneys. Other symptoms of a kidney infection include pain in your back or sides below the ribs, nausea, and vomiting. DIAGNOSIS To diagnose a UTI, your caregiver will ask you about your symptoms. Your caregiver will also ask you to provide a urine sample. The urine sample will be tested for bacteria and white blood cells. White blood cells are made by your body to help fight infection. TREATMENT  Typically, UTIs can be treated with medication. Because most UTIs are caused by a bacterial infection, they usually can be treated with the use of antibiotics. The choice of antibiotic and length of treatment depend on your symptoms and the  type of bacteria causing your infection. HOME CARE INSTRUCTIONS  If you were prescribed antibiotics, take them exactly as your caregiver instructs you. Finish the medication even if you feel better after you have only taken some of the medication.  Drink enough water and fluids to keep your urine clear or pale yellow.  Avoid caffeine, tea, and carbonated beverages. They tend to irritate your bladder.  Empty your bladder often. Avoid holding urine for long periods of time.  Empty your bladder before and after sexual intercourse.  After a bowel movement, women should cleanse from front to back. Use each tissue only once. SEEK MEDICAL CARE IF:   You have back pain.  You develop a fever.  Your symptoms do not begin to resolve within 3 days. SEEK IMMEDIATE MEDICAL CARE IF:   You have severe back pain or lower abdominal pain.  You develop chills.  You have nausea or vomiting.  You have continued burning or discomfort with urination. MAKE SURE YOU:   Understand these instructions.  Will watch your condition.  Will get help right away if you are not doing well or get worse.   This information is not intended to replace advice given to you by your health care provider. Make sure you discuss any questions you have with your health care provider.   Document Released: 01/31/2005 Document Revised: 01/12/2015 Document Reviewed: 06/01/2011 Elsevier Interactive Patient Education 2016 Elsevier Inc.   Shingles Shingles, which is also known as herpes zoster, is an infection that causes  a painful skin rash and fluid-filled blisters. Shingles is not related to genital herpes, which is a sexually transmitted infection.   Shingles only develops in people who:  Have had chickenpox.  Have received the chickenpox vaccine. (This is rare.) CAUSES Shingles is caused by varicella-zoster virus (VZV). This is the same virus that causes chickenpox. After exposure to VZV, the virus stays in the  body in an inactive (dormant) state. Shingles develops if the virus reactivates. This can happen many years after the initial exposure to VZV. It is not known what causes this virus to reactivate. RISK FACTORS People who have had chickenpox or received the chickenpox vaccine are at risk for shingles. Infection is more common in people who:  Are older than age 47.  Have a weakened defense (immune) system, such as those with HIV, AIDS, or cancer.  Are taking medicines that weaken the immune system, such as transplant medicines.  Are under great stress. SYMPTOMS Early symptoms of this condition include itching, tingling, and pain in an area on your skin. Pain may be described as burning, stabbing, or throbbing. A few days or weeks after symptoms start, a painful red rash appears, usually on one side of the body in a bandlike or beltlike pattern. The rash eventually turns into fluid-filled blisters that break open, scab over, and dry up in about 2-3 weeks. At any time during the infection, you may also develop:  A fever.  Chills.  A headache.  An upset stomach. DIAGNOSIS This condition is diagnosed with a skin exam. Sometimes, skin or fluid samples are taken from the blisters before a diagnosis is made. These samples are examined under a microscope or sent to a lab for testing. TREATMENT There is no specific cure for this condition. Your health care provider will probably prescribe medicines to help you manage pain, recover more quickly, and avoid long-term problems. Medicines may include:  Antiviral drugs.  Anti-inflammatory drugs.  Pain medicines. If the area involved is on your face, you may be referred to a specialist, such as an eye doctor (ophthalmologist) or an ear, nose, and throat (ENT) doctor to help you avoid eye problems, chronic pain, or disability. HOME CARE INSTRUCTIONS Medicines  Take medicines only as directed by your health care provider.  Apply an anti-itch or  numbing cream to the affected area as directed by your health care provider. Blister and Rash Care  Take a cool bath or apply cool compresses to the area of the rash or blisters as directed by your health care provider. This may help with pain and itching.  Keep your rash covered with a loose bandage (dressing). Wear loose-fitting clothing to help ease the pain of material rubbing against the rash.  Keep your rash and blisters clean with mild soap and cool water or as directed by your health care provider.  Check your rash every day for signs of infection. These include redness, swelling, and pain that lasts or increases.  Do not pick your blisters.  Do not scratch your rash. General Instructions  Rest as directed by your health care provider.  Keep all follow-up visits as directed by your health care provider. This is important.  Until your blisters scab over, your infection can cause chickenpox in people who have never had it or been vaccinated against it. To prevent this from happening, avoid contact with other people, especially:  Babies.  Pregnant women.  Children who have eczema.  Elderly people who have transplants.  People who have  chronic illnesses, such as leukemia or AIDS. SEEK MEDICAL CARE IF:  Your pain is not relieved with prescribed medicines.  Your pain does not get better after the rash heals.  Your rash looks infected. Signs of infection include redness, swelling, and pain that lasts or increases. SEEK IMMEDIATE MEDICAL CARE IF:  The rash is on your face or nose.  You have facial pain, pain around your eye area, or loss of feeling on one side of your face.  You have ear pain or you have ringing in your ear.  You have loss of taste.  Your condition gets worse.   This information is not intended to replace advice given to you by your health care provider. Make sure you discuss any questions you have with your health care provider.   Document  Released: 04/23/2005 Document Revised: 05/14/2014 Document Reviewed: 03/04/2014 Elsevier Interactive Patient Education 2016 [employer].    IF you received an x-ray today, you will receive an invoice from [facility]. Please contact [facility] at [phone] with questions or concerns regarding your invoice.   IF you received labwork today, you will receive an invoice from [employer]. Please contact [facility] at [phone] with questions or concerns regarding your invoice.   Our billing staff will not be able to assist you with questions regarding bills from these companies.  You will be contacted with the lab results as soon as they are available. The fastest way to get your results is to activate your My Chart account. Instructions are located on the last page of this paperwork. If you have not heard from [place] regarding the results in 2 weeks, please contact this office.

## 2015-11-20 LAB — URINE CULTURE: Colony Count: 5000

## 2016-05-25 ENCOUNTER — Telehealth: Payer: Self-pay | Admitting: Emergency Medicine

## 2016-05-25 ENCOUNTER — Ambulatory Visit (INDEPENDENT_AMBULATORY_CARE_PROVIDER_SITE_OTHER): Payer: Managed Care, Other (non HMO) | Admitting: Physician Assistant

## 2016-05-25 VITALS — BP 122/72 | HR 94 | Temp 97.9°F | Resp 16 | Ht 64.0 in | Wt 181.0 lb

## 2016-05-25 DIAGNOSIS — J111 Influenza due to unidentified influenza virus with other respiratory manifestations: Secondary | ICD-10-CM

## 2016-05-25 DIAGNOSIS — R69 Illness, unspecified: Secondary | ICD-10-CM

## 2016-05-25 LAB — POCT INFLUENZA A/B
INFLUENZA A, POC: NEGATIVE
INFLUENZA B, POC: NEGATIVE

## 2016-05-25 MED ORDER — HYDROCODONE-HOMATROPINE 5-1.5 MG/5ML PO SYRP: 2.5000 mL | ORAL_SOLUTION | Freq: Every evening | ORAL | 0 refills | Status: DC | PRN

## 2016-05-25 NOTE — Progress Notes (Signed)
05/25/2016 3:39 PM   DOB: Apr 29, 1971 / MRN: [id number]  SUBJECTIVE:  Megan Austin is a 46 y.o. female presenting for myalgia that started on Wednesay.  She associates a headache, cough, and sneezing. Tmax 101 last night. reports she is feeling better. Has been taking tylenol and ibuprofen which has been helpful.    There is no immunization history on file for this patient.   She is allergic to amoxicillin; ceclor [cefaclor]; keflex [cephalexin]; lorabid [loracarbef]; and septra [sulfamethoxazole-trimethoprim].   She  has a past medical history of Allergy and Diabetes mellitus without complication (HCC).    She  reports that she has never smoked. She has never used smokeless tobacco. She reports that she does not drink alcohol or use drugs. She  has no sexual activity history on file. The patient  has no past surgical history on file.  Her family history is not on file.  Review of Systems  Constitutional: Positive for chills, fever and malaise/fatigue.  HENT: Positive for congestion.   Respiratory: Positive for cough. Negative for hemoptysis, sputum production, shortness of breath and wheezing.   Cardiovascular: Negative for chest pain and leg swelling.  Genitourinary: Negative for dysuria, frequency and urgency.  Musculoskeletal: Positive for myalgias.  Neurological: Positive for headaches. Negative for dizziness.    The problem list and medications were reviewed and updated by myself where necessary and exist elsewhere in the encounter.   OBJECTIVE:  BP 122/72   Pulse 94   Temp 97.9 F (36.6 C) (Oral)   Resp 16   Ht 5\' 4"  (1.626 m)   Wt 181 lb (82.1 kg)   SpO2 99%   BMI 31.07 kg/m   Physical Exam  Constitutional: She is oriented to person, place, and time.  HENT:  Right Ear: External ear normal.  Left Ear: External ear normal.  Nose: Mucosal edema present. Right sinus exhibits no maxillary sinus tenderness and no frontal sinus tenderness. Left sinus exhibits no  maxillary sinus tenderness and no frontal sinus tenderness.  Mouth/Throat: Oropharynx is clear and moist. No oropharyngeal exudate.  Eyes: Conjunctivae are normal. Pupils are equal, round, and reactive to light.  Cardiovascular: Regular rhythm and normal heart sounds.   Pulmonary/Chest: Effort normal and breath sounds normal. She has no rales.  Neurological: She is alert and oriented to person, place, and time.  Skin: Skin is warm and dry. No rash noted. She is not diaphoretic. No erythema.  Psychiatric: Her behavior is normal.    Results for orders placed or performed in visit on 05/25/16 (from the past 72 hour(s))  POCT Influenza A/B     Status: None   Collection Time: 05/25/16  3:30 PM  Result Value Ref Range   Influenza A, POC Negative Negative   Influenza B, POC Negative Negative    No results found.  ASSESSMENT AND PLAN:  Megan Austin was seen today for generalized body aches, fever, cough and other.  Diagnoses and all orders for this visit:  Influenza-like illness: She is improving.  She has a normal exam.  Advised she stay the course with ibuprofen and tylenol.  Hycodan for cough.  Will see her back on Monday if symptoms change or worsen.  -     POCT Influenza A/B    The patient is advised to call or return to clinic if she does not see an improvement in symptoms, or to seek the care of the closest emergency department if she worsens with the above plan.   Deliah Boston, MHS,  PA-C [facility] 05/25/2016 3:39 PM

## 2016-05-25 NOTE — Patient Instructions (Signed)
     IF you received an x-ray today, you will receive an invoice from [facility]. Please contact [facility] at [phone] with questions or concerns regarding your invoice.   IF you received labwork today, you will receive an invoice from [facility]. Please contact [facility] at [phone] with questions or concerns regarding your invoice.   Our billing staff will not be able to assist you with questions regarding bills from these companies.  You will be contacted with the lab results as soon as they are available. The fastest way to get your results is to activate your My Chart account. Instructions are located on the last page of this paperwork. If you have not heard from us regarding the results in 2 weeks, please contact this office.     

## 2016-09-23 ENCOUNTER — Emergency Department (HOSPITAL_COMMUNITY)
Admission: EM | Admit: 2016-09-23 | Discharge: 2016-09-23 | Disposition: A | Discharge status: Home / Self Care | Payer: Medicaid Other | Attending: Emergency Medicine | Admitting: Emergency Medicine

## 2016-09-23 ENCOUNTER — Telehealth: Payer: Self-pay | Admitting: *Deleted

## 2016-09-23 ENCOUNTER — Encounter (HOSPITAL_COMMUNITY): Payer: Self-pay | Admitting: Emergency Medicine

## 2016-09-23 DIAGNOSIS — K047 Periapical abscess without sinus: Secondary | ICD-10-CM | POA: Diagnosis not present

## 2016-09-23 DIAGNOSIS — E119 Type 2 diabetes mellitus without complications: Secondary | ICD-10-CM | POA: Insufficient documentation

## 2016-09-23 DIAGNOSIS — Z79899 Other long term (current) drug therapy: Secondary | ICD-10-CM | POA: Diagnosis not present

## 2016-09-23 DIAGNOSIS — K0889 Other specified disorders of teeth and supporting structures: Secondary | ICD-10-CM

## 2016-09-23 MED ORDER — MAGIC MOUTHWASH
5.0000 mL | Freq: Once | ORAL | Status: AC
  Administered 2016-09-23: 5 mL via ORAL
  Filled 2016-09-23: qty 5

## 2016-09-23 MED ORDER — LIDOCAINE-EPINEPHRINE (PF) 1 %-1:200000 IJ SOLN: 5.0000 mL | Freq: Once | INTRAMUSCULAR | 0 refills | Status: AC

## 2016-09-23 MED ORDER — OXYCODONE-ACETAMINOPHEN 5-325 MG PO TABS: 1.0000 | ORAL_TABLET | Freq: Once | ORAL | Status: DC

## 2016-09-23 MED ORDER — KETOROLAC TROMETHAMINE 15 MG/ML IJ SOLN
15.0000 mg | Freq: Once | INTRAMUSCULAR | Status: AC
  Administered 2016-09-23: 15 mg via INTRAMUSCULAR
  Filled 2016-09-23: qty 1

## 2016-09-23 MED ORDER — BUPIVACAINE-EPINEPHRINE (PF) 0.5% -1:200000 IJ SOLN
1.8000 mL | Freq: Once | INTRAMUSCULAR | Status: AC
  Administered 2016-09-23: 1.8 mL
  Filled 2016-09-23: qty 1.8

## 2016-09-23 MED ORDER — MAGIC MOUTHWASH: 5.0000 mL | Freq: Three times a day (TID) | ORAL | 0 refills | Status: DC | PRN

## 2016-09-23 MED ORDER — CLINDAMYCIN HCL 150 MG PO CAPS: 450.0000 mg | ORAL_CAPSULE | Freq: Three times a day (TID) | ORAL | 0 refills | Status: AC

## 2016-09-23 MED ORDER — HYDROCODONE-ACETAMINOPHEN 5-325 MG PO TABS: 2.0000 | ORAL_TABLET | Freq: Four times a day (QID) | ORAL | 0 refills | Status: DC | PRN

## 2016-09-23 MED ORDER — ACETAMINOPHEN 500 MG PO TABS
1000.0000 mg | ORAL_TABLET | Freq: Once | ORAL | Status: AC
  Administered 2016-09-23: 1000 mg via ORAL
  Filled 2016-09-23: qty 2

## 2016-09-23 NOTE — Discharge Instructions (Signed)
As discussed, follow up with a dentist in the morning. Mouthwash and pain medicine as needed. Return if you experience difficulty swallow, breathing or new concerning symptoms in the meantime.

## 2016-09-23 NOTE — ED Notes (Signed)
Pt still crying with pain. "When am I going to get that block". PA informed.

## 2016-09-23 NOTE — ED Notes (Signed)
Rt upper gum abcess has taken motrin but still hurts started to get bad on wed

## 2016-09-23 NOTE — ED Notes (Signed)
Pt called out saying she was in a lot of pain and someone needed to help her, nurse notifed.

## 2016-09-23 NOTE — ED Provider Notes (Signed)
[facility]-EMERGENCY DEPT Provider Note   CSN: [id number] Arrival date & time: 09/23/16  0909  By signing my name below, I, Rosana Fret, attest that this documentation has been prepared under the direction and in the presence of non-physician practitioner, Shanda Bumps B. Clovis Riley, PA-C. Electronically Signed: Rosana Fret, ED Scribe. 09/23/16. 10:59 AM.  History   Chief Complaint Chief Complaint  Patient presents with  . Dental Problem  . Abscess    The history is provided by the patient. No language interpreter was used.   HPI Comments: Megan Austin is a 46 y.o. female who presents to the Emergency Department complaining of a moderate, gradually worsening area of pain and swelling to the mouth onset 4 days ago. Pt describes pain as a radiating through her upper right portion of her mouth and notes that "bubbling" appeared last night. Pt states pain is exacerbated by eating but she is able to drink fluids. Pt states she tried Tylenol and Motrin with minimal relief. Pt denies fever, chills, nausea, vomiting, difficulty breathing, difficulty swallowing or any other complaints at this time.  Past Medical History:  Diagnosis Date  . Allergy   . Diabetes mellitus without complication (HCC)     There are no active problems to display for this patient.   History reviewed. No pertinent surgical history.  OB History    No data available       Home Medications    Prior to Admission medications   Medication Sig Start Date End Date Taking? Authorizing Provider  bupivacaine 30 mL in lidocaine-EPINEPHrine 1 %-1:200000 30 mL Inject 5 mLs as directed once. 09/23/16 09/23/16  Mathews Robinsons B, PA-C  clindamycin (CLEOCIN) 150 MG capsule Take 3 capsules (450 mg total) by mouth 3 (three) times daily. 09/23/16 09/30/16  Georgiana Shore, PA-C  HYDROcodone-acetaminophen (NORCO/VICODIN) 5-325 MG tablet Take 2 tablets by mouth every 6 (six) hours as needed for severe pain. 09/23/16   Mathews Robinsons B, PA-C  HYDROcodone-homatropine (HYCODAN) 5-1.5 MG/5ML syrup Take 2.5-5 mLs by mouth at bedtime as needed. 05/25/16   Ofilia Neas, PA-C  magic mouthwash SOLN Take 5 mLs by mouth 3 (three) times daily as needed for mouth pain. 09/23/16   Georgiana Shore, PA-C  valACYclovir (VALTREX) 1000 MG tablet Take 1 tablet (1,000 mg total) by mouth 3 (three) times daily. Patient not taking: Reported on 05/25/2016 11/19/15   Rodolph Bong, MD    Family History No family history on file.  Social History Social History  Substance Use Topics  . Smoking status: Never Smoker  . Smokeless tobacco: Never Used  . Alcohol use No     Allergies   Amoxicillin; Ceclor [cefaclor]; Keflex [cephalexin]; Lorabid [loracarbef]; and Septra [sulfamethoxazole-trimethoprim]   Review of Systems Review of Systems  Constitutional: Negative for chills and fever.  HENT: Positive for dental problem. Negative for drooling, ear pain, facial swelling, sore throat and trouble swallowing.   Eyes: Negative for pain and visual disturbance.  Respiratory: Negative for cough, shortness of breath, wheezing and stridor.   Cardiovascular: Negative for chest pain and palpitations.  Gastrointestinal: Negative for abdominal pain, nausea and vomiting.  Genitourinary: Negative for dysuria and hematuria.  Musculoskeletal: Negative for arthralgias, back pain, neck pain and neck stiffness.  Skin: Negative for color change, pallor and rash.  Neurological: Negative for dizziness, seizures, syncope, facial asymmetry, weakness, light-headedness and headaches.     Physical Exam Updated Vital Signs BP (!) 140/99 (BP Location: Left Arm)   Pulse 83  Temp 98.6 F (37 C) (Oral)   Resp 16   Ht 5\' 4"  (1.626 m)   Wt 170 lb (77.1 kg)   LMP 08/26/2016   SpO2 100%   BMI 29.18 kg/m   Physical Exam  Constitutional: She is oriented to person, place, and time. She appears well-developed and well-nourished.  Patient is afebrile,  non-toxic appearing, seating comfortably in chair in no acute distress.  HENT:  Head: Normocephalic and atraumatic.  Mouth/Throat: Uvula is midline. Dental abscesses present. No oropharyngeal exudate or tonsillar abscesses.    Sublingual mucosa is soft and non-tender. No exudate. No concern for Ludwig's angina.   Eyes: Pupils are equal, round, and reactive to light.  Neck: Neck supple.  Cardiovascular: Normal rate.   Pulmonary/Chest: Effort normal.  Lymphadenopathy:    She has no cervical adenopathy.  Neurological: She is alert and oriented to person, place, and time.  Skin: Skin is warm and dry.  Psychiatric: She has a normal mood and affect.  Nursing note and vitals reviewed.    ED Treatments / Results  DIAGNOSTIC STUDIES: Oxygen Saturation is 100% on RA, normal by my interpretation.   COORDINATION OF CARE: 10:54 AM-Discussed next steps with pt including abcess draining, pain management at home, and following up with a dentist. Pt verbalized understanding and is agreeable with the plan.   Labs (all labs ordered are listed, but only abnormal results are displayed) Labs Reviewed - No data to display  EKG  EKG Interpretation None       Radiology No results found.  Procedures Performed by Dr. Erma Heritage and Mathews Robinsons .Nerve Block Date/Time: 09/23/2016 11:54 AM Performed by: Mathews Robinsons B Authorized by: Mathews Robinsons B   Consent:    Consent obtained:  Verbal   Consent given by:  Patient   Risks discussed:  Allergic reaction, bleeding, infection and pain   Alternatives discussed:  No treatment and delayed treatment Indications:    Indications:  Procedural anesthesia Location:    Body area:  Head (dental)   Laterality:  Right Procedure details (see MAR for exact dosages):    Anesthetic injected:  Bupivacaine 0.25% WITH epi Post-procedure details:    Dressing:  None   Outcome:  Anesthesia achieved   Patient tolerance of procedure:  Tolerated  well, no immediate complications .Marland KitchenIncision and Drainage Date/Time: 09/23/2016 12:06 PM Performed by: Mathews Robinsons B Authorized by: Mathews Robinsons B   Consent:    Consent obtained:  Verbal   Consent given by:  Patient   Risks discussed:  Bleeding, incomplete drainage, infection and pain   Alternatives discussed:  No treatment, delayed treatment and referral Location:    Type:  Abscess   Size:  5 mm   Location:  Mouth Anesthesia (see MAR for exact dosages):    Anesthesia method:  Nerve block   Block anesthetic:  Bupivacaine 0.25% WITH epi   Block outcome:  Anesthesia achieved Procedure type:    Complexity:  Simple Procedure details:    Scalpel blade:  11   Wound treatment:  Wound left open Post-procedure details:    Patient tolerance of procedure:  Tolerated well, no immediate complications   (including critical care time)  Medications Ordered in ED Medications  acetaminophen (TYLENOL) tablet 1,000 mg (1,000 mg Oral Given 09/23/16 1050)  bupivacaine-epinephrine (MARCAINE W/ EPI) 0.5% -1:200000 injection 1.8 mL (1.8 mLs Infiltration Given 09/23/16 1204)  ketorolac (TORADOL) 15 MG/ML injection 15 mg (15 mg Intramuscular Given 09/23/16 1235)  magic mouthwash (5 mLs Oral Given  09/23/16 1236)  bupivacaine-epinephrine (MARCAINE W/ EPI) 0.5% -1:200000 injection 1.8 mL (1.8 mLs Infiltration Given 09/23/16 1320)     Initial Impression / Assessment and Plan / ED Course  I have reviewed the triage vital signs and the nursing notes.  Pertinent labs & imaging results that were available during my care of the patient were reviewed by me and considered in my medical decision making (see chart for details).     Patient with toothache and small abscess.  Exam unconcerning for Ludwig's angina or spread of infection.  Patient was crying in pain. Otherwise reassuring exam, afebrile, nontoxic-appearing. Will treat with Clindamycin and pain medicine.  Urged patient to follow-up with dentist.    Patients pain was managed in ED and procedures well-tolerated.  Patient was discussed with Dr. Erma HeritageIsaacs who has seen patient and agrees with assessment and plan and helped with nerve block and I&D.   Discussed strict return precautions and advised to return to the emergency department if experiencing any new or worsening symptoms. Instructions were understood and patient agreed with discharge plan.  Final Clinical Impressions(s) / ED Diagnoses   Final diagnoses:  Dental abscess  Tooth pain    New Prescriptions Discharge Medication List as of 09/23/2016  1:08 PM    START taking these medications   Details  bupivacaine 30 mL in lidocaine-EPINEPHrine 1 %-1:200000 30 mL Inject 5 mLs as directed once., Starting Sun 09/23/2016, Print    clindamycin (CLEOCIN) 150 MG capsule Take 3 capsules (450 mg total) by mouth 3 (three) times daily., Starting Sun 09/23/2016, Until Sun 09/30/2016, Print    HYDROcodone-acetaminophen (NORCO/VICODIN) 5-325 MG tablet Take 2 tablets by mouth every 6 (six) hours as needed for severe pain., Starting Sun 09/23/2016, Print    magic mouthwash SOLN Take 5 mLs by mouth 3 (three) times daily as needed for mouth pain., Starting Sun 09/23/2016, Print       *note*: no bupivicaine prescribed. Malfunction in EPIC ordering going to discharge. Unable to delete from chart.  I personally performed the services described in this documentation, which was scribed in my presence. The recorded information has been reviewed and is accurate.    Georgiana ShoreMitchell, Sherah Lund B, PA-C 09/23/16 1539    Shaune PollackIsaacs, Cameron, MD 09/24/16 [phone]

## 2016-09-23 NOTE — ED Notes (Signed)
Pt states she is driving

## 2016-09-23 NOTE — Telephone Encounter (Signed)
Clarified [employer] Mouthwash Rx as follows: Composition to be Mixed in 480 ml Bottle   120 ml Maalox Extra Strength   60 mls Nystatin 300 mls Benadryl  Then 120 MM mixed with 40 cc 2% Lidocaine. Then called to clarify amount to be given as 100cc Verified with Marisa Cyphersaameron Isaacs, MD. Clent Demarkelayed all [facility] No further CM needs.

## 2016-09-23 NOTE — ED Notes (Signed)
Pt requesting pain meds

## 2016-09-23 NOTE — ED Triage Notes (Signed)
Pt. Stated, I have I think an abscess on the right upper gum for about 4 days. Unable to eat, drink.

## 2016-09-23 NOTE — ED Notes (Signed)
Pt refused tylenol

## 2016-12-04 ENCOUNTER — Emergency Department (HOSPITAL_COMMUNITY): Payer: Medicaid Other

## 2016-12-04 ENCOUNTER — Encounter (HOSPITAL_COMMUNITY): Payer: Self-pay | Admitting: *Deleted

## 2016-12-04 ENCOUNTER — Emergency Department (HOSPITAL_COMMUNITY)
Admission: EM | Admit: 2016-12-04 | Discharge: 2016-12-04 | Disposition: A | Discharge status: Home / Self Care | Payer: Medicaid Other | Attending: Emergency Medicine | Admitting: Emergency Medicine

## 2016-12-04 ENCOUNTER — Encounter (HOSPITAL_COMMUNITY): Payer: Self-pay | Admitting: Emergency Medicine

## 2016-12-04 DIAGNOSIS — S52501A Unspecified fracture of the lower end of right radius, initial encounter for closed fracture: Secondary | ICD-10-CM | POA: Diagnosis not present

## 2016-12-04 DIAGNOSIS — S59912A Unspecified injury of left forearm, initial encounter: Secondary | ICD-10-CM | POA: Diagnosis present

## 2016-12-04 DIAGNOSIS — E119 Type 2 diabetes mellitus without complications: Secondary | ICD-10-CM | POA: Diagnosis not present

## 2016-12-04 DIAGNOSIS — Y998 Other external cause status: Secondary | ICD-10-CM | POA: Diagnosis not present

## 2016-12-04 DIAGNOSIS — W109XXA Fall (on) (from) unspecified stairs and steps, initial encounter: Secondary | ICD-10-CM | POA: Insufficient documentation

## 2016-12-04 DIAGNOSIS — S52224A Nondisplaced transverse fracture of shaft of right ulna, initial encounter for closed fracture: Secondary | ICD-10-CM | POA: Diagnosis not present

## 2016-12-04 DIAGNOSIS — Y9301 Activity, walking, marching and hiking: Secondary | ICD-10-CM | POA: Insufficient documentation

## 2016-12-04 DIAGNOSIS — Y929 Unspecified place or not applicable: Secondary | ICD-10-CM | POA: Insufficient documentation

## 2016-12-04 DIAGNOSIS — S52601A Unspecified fracture of lower end of right ulna, initial encounter for closed fracture: Secondary | ICD-10-CM | POA: Diagnosis not present

## 2016-12-04 DIAGNOSIS — S52324A Nondisplaced transverse fracture of shaft of right radius, initial encounter for closed fracture: Secondary | ICD-10-CM | POA: Diagnosis not present

## 2016-12-04 MED ORDER — HYDROCODONE-ACETAMINOPHEN 5-325 MG PO TABS: 1.0000 | ORAL_TABLET | ORAL | 0 refills | Status: DC | PRN

## 2016-12-04 MED ORDER — FENTANYL CITRATE (PF) 100 MCG/2ML IJ SOLN
INTRAMUSCULAR | Status: AC
  Filled 2016-12-04: qty 2

## 2016-12-04 MED ORDER — FENTANYL CITRATE (PF) 100 MCG/2ML IJ SOLN
50.0000 ug | Freq: Once | INTRAMUSCULAR | Status: AC
  Administered 2016-12-04: 50 ug via INTRAVENOUS

## 2016-12-04 MED ORDER — PROPOFOL 10 MG/ML IV BOLUS
200.0000 mg | Freq: Once | INTRAVENOUS | Status: AC
  Administered 2016-12-04: 100 mg via INTRAVENOUS
  Filled 2016-12-04: qty 20

## 2016-12-04 MED ORDER — HYDROCODONE-ACETAMINOPHEN 5-325 MG PO TABS
1.0000 | ORAL_TABLET | Freq: Once | ORAL | Status: AC
  Administered 2016-12-04: 1 via ORAL
  Filled 2016-12-04: qty 1

## 2016-12-04 MED ORDER — FENTANYL CITRATE (PF) 100 MCG/2ML IJ SOLN
50.0000 ug | Freq: Once | INTRAMUSCULAR | Status: AC
Start: 2016-12-04 — End: 2016-12-04
  Administered 2016-12-04: 50 ug via INTRAVENOUS
  Filled 2016-12-04: qty 2

## 2016-12-04 NOTE — ED Provider Notes (Addendum)
Patient states she fell at home tonight and has pain in her right forearm. Patient states she is right-handed.  On exam patient has diffuse swelling and some mild deformity of her distal right forearm. She has intact range of motion of her fingers and intact sensation. Her pulses are normal.  Patient was discussed with Dr. Orlan Leavensrtman, hand specialist, at 7:27 AM. He wants us to reduce her forearm fracture in the ED and splint her and have her come to the office as an outpatient.   Procedural sedation Performed by: Ward GivensIva L Zachrey Deutscher Consent: Verbal consent obtained. Risks and benefits: risks, benefits and alternatives were discussed Required items: required blood products, implants, devices, and special equipment available Patient identity confirmed: arm band and provided demographic data Time out: Immediately prior to procedure a "time out" was called to verify the correct patient, procedure, equipment, support staff and site/side marked as required.  Sedation type: moderate (conscious) sedation NPO time confirmed and considedered  Sedatives: 100 mg PROPOFOL + fentanyl 50 mcg IV Start time was 7:34 AM Patient was easily arousable at 7:42 AM. Post reduction x-ray was done.  Physician Time at Bedside: 20 min  Vitals: Vital signs were monitored during sedation. Cardiac Monitor, pulse oximeter Patient tolerance: Patient tolerated the procedure well with no immediate complications. Comments: Pt with uneventful recovered. Returned to pre-procedural sedation baseline  Reduction of Fracture Date/Time: 8:03 AM Performed by: Ward GivensIva L Alica Shellhammer Authorized by: Ward GivensIva L Aria Pickrell Consent: Verbal and written consent obtained. Risks and benefits: risks, benefits and alternatives were discussed Consent given by: patient Required items: required blood products, implants, devices, and special equipment available Time out: Immediately prior to procedure a "time out" was called to verify the correct patient, procedure,  equipment, support staff and site/side marked as required.  Patient sedated: with propofol  Vitals: Vital signs were monitored during sedation. Patient tolerance: Patient tolerated the procedure well with no immediate complications. Traction and countertraction was performed by myself and PA Leaphartwith visual improvement of her deformity. On palpation she also appears to have better alignment although I continue to feel movement of the bone as we are trying to splint her arm. Patient had a sugar tong splint applied after she was reduced. She was also placed in a sling.  Patient indicates her pain is improved after reduction.  Medical screening examination/treatment/procedure(s) were conducted as a shared visit with non-physician practitioner(s) and myself.  I personally evaluated the patient during the encounter.    Devoria AlbeIva Ahmadou Bolz, MD, Concha PyoFACEP    Mohogany Toppins, MD 12/04/16 [id number]    Devoria AlbeKnapp, Jasiel Apachito, MD 12/04/16 [phone]

## 2016-12-04 NOTE — Discharge Instructions (Signed)
Hydrocodone as needed for pain. Ice affected area (see instructions below).  Please call the orthopedic physician listed today or first thing in the morning to schedule a follow up appointment.   Please call Dr. Glenna Durandrtmann;s office today and say that you had a fracture that was reduced in the ED and he was counseled that he needs to be seen in the office this week.  Fractures generally take 4-6 weeks to heal. It is very important to keep your splint dry until your follow up with the orthopedic doctor and a cast can be applied. You may place a plastic bag around the extremity with the splint while bathing to keep it dry. Also try to sleep with the extremity elevated for the next several nights to decrease swelling. Check the fingertips and toes several times per day to make sure they are not cold, pale, or blue. If this is the case, the splint may be too tight and should return to the ER, your regular doctor or the orthopedist for recheck. Return to the ER for new or worsening symptoms, any additional concerns.   COLD THERAPY DIRECTIONS:  Ice or gel packs can be used to reduce both pain and swelling. Ice is the most helpful within the first 24 to 48 hours after an injury or flareup from overusing a muscle or joint.  Ice is effective, has very few side effects, and is safe for most people to use.   If you expose your skin to cold temperatures for too long or without the proper protection, you can damage your skin or nerves. Watch for signs of skin damage due to cold.   HOME CARE INSTRUCTIONS  Follow these tips to use ice and cold packs safely.  Place a dry or damp towel between the ice and skin. A damp towel will cool the skin more quickly, so you may need to shorten the time that the ice is used.  For a more rapid response, add gentle compression to the ice.  Ice for no more than 10 to 20 minutes at a time. The bonier the area you are icing, the less time it will take to get the benefits of ice.  Check  your skin after 5 minutes to make sure there are no signs of a poor response to cold or skin damage.  Rest 20 minutes or more in between uses.  Once your skin is numb, you can end your treatment. You can test numbness by very lightly touching your skin. The touch should be so light that you do not see the skin dimple from the pressure of your fingertip. When using ice, most people will feel these normal sensations in this order: cold, burning, aching, and numbness.

## 2016-12-04 NOTE — Progress Notes (Signed)
Orthopedic Tech Progress Note Patient Details:  Caro HightDiane K Mcgillivray Jul 17, 1970 [id number]  Patient ID: Caro Hightiane K Mule, female   DOB: Jul 17, 1970, 46 y.o.   MRN: [id number]   Nikki DomCrawford, Markie Frith 12/04/2016, 7:46 AM Fiberglass long leg splint to be deleted

## 2016-12-04 NOTE — Progress Notes (Signed)
Orthopedic Tech Progress Note Patient Details:  Caro HightDiane K Cardy March 07, 1971 [id number]  Ortho Devices Type of Ortho Device: Ace wrap, Arm sling, Sugartong splint Ortho Device/Splint Location: rue Ortho Device/Splint Interventions: Application   Nikki DomCrawford, Jeric Slagel 12/04/2016, 7:46 AM As ordered by Dr. Lynelle DoctorKnapp

## 2016-12-04 NOTE — ED Provider Notes (Signed)
[facility]-EMERGENCY DEPT Provider Note   CSN: [id number] Arrival date & time: 12/04/16  0125     History   Chief Complaint Chief Complaint  Patient presents with  . Arm Pain    HPI Megan Austin is a 46 y.o. female.  HPI 46 year old Caucasian female presents to the ED today with complaints of right arm pain. Patient states that she was walking up a set of stairs on the carpet when she tripped over her shoes landing on her right arm. States that she had immediate pain. Movement makes the pain worse. Holding the arm still makes the pain better. Denies any paresthesias, weakness, open wound. Patient denies any blood thinners, LOC, head injury. All he complains of pain to the right arm. She has not taken anything for her pain prior to arrival. Nothing makes better or worse. Past Medical History:  Diagnosis Date  . Allergy   . Diabetes mellitus without complication (HCC)     There are no active problems to display for this patient.   History reviewed. No pertinent surgical history.  OB History    No data available       Home Medications    Prior to Admission medications   Medication Sig Start Date End Date Taking? Authorizing Provider  HYDROcodone-acetaminophen (NORCO/VICODIN) 5-325 MG tablet Take 2 tablets by mouth every 6 (six) hours as needed for severe pain. Patient not taking: Reported on 12/04/2016 09/23/16   Georgiana Shore, PA-C  HYDROcodone-homatropine [facility]) 5-1.5 MG/5ML syrup Take 2.5-5 mLs by mouth at bedtime as needed. Patient not taking: Reported on 12/04/2016 05/25/16   Ofilia Neas, PA-C  magic mouthwash SOLN Take 5 mLs by mouth 3 (three) times daily as needed for mouth pain. Patient not taking: Reported on 12/04/2016 09/23/16   Georgiana Shore, PA-C  valACYclovir (VALTREX) 1000 MG tablet Take 1 tablet (1,000 mg total) by mouth 3 (three) times daily. Patient not taking: Reported on 05/25/2016 11/19/15   Rodolph Bong, MD    Family History No family  history on file.  Social History Social History  Substance Use Topics  . Smoking status: Never Smoker  . Smokeless tobacco: Never Used  . Alcohol use No     Allergies   Amoxicillin; Ceclor [cefaclor]; Keflex [cephalexin]; Lorabid [loracarbef]; and Septra [sulfamethoxazole-trimethoprim]   Review of Systems Review of Systems  Constitutional: Negative for chills and fever.  Gastrointestinal: Negative for nausea and vomiting.  Musculoskeletal: Positive for arthralgias, joint swelling and myalgias.  Skin: Positive for color change. Negative for wound.  Neurological: Negative for dizziness, weakness, numbness and headaches.     Physical Exam Updated Vital Signs BP (!) 151/91   Pulse 97   Temp 98.5 F (36.9 C) (Oral)   Resp 16   Ht 5\' 4"  (1.626 m)   Wt 77.1 kg (170 lb)   LMP 11/06/2016 (Approximate)   SpO2 100%   BMI 29.18 kg/m   Physical Exam  Constitutional: She appears well-developed and well-nourished. No distress.  HENT:  Head: Normocephalic and atraumatic.  Eyes: Right eye exhibits no discharge. Left eye exhibits no discharge. No scleral icterus.  Neck: Normal range of motion.  Cardiovascular: Intact distal pulses.   Pulmonary/Chest: No respiratory distress.  Musculoskeletal: Normal range of motion.  Patient with obvious deformity to the right forearm. Mild tinting. Ecchymosis and edema noted. No open wound. Limited range of motion due to pain. No pain with palpation or movement of the elbow joint. Range of motion of all phalanges  of the right hand. Sensation intact in all dermatomes. Cap refill is normal. Radial pulses are 2+ bilaterally.  Neurological: She is alert.  Skin: Skin is warm and dry. Capillary refill takes less than 2 seconds. No pallor.  Psychiatric: Her behavior is normal. Judgment and thought content normal.  Nursing note and vitals reviewed.    ED Treatments / Results  Labs (all labs ordered are listed, but only abnormal results are  displayed) Labs Reviewed - No data to display  EKG  EKG Interpretation None       Radiology Dg Forearm Right  Result Date: 12/04/2016 CLINICAL DATA:  Postreduction for forearm fractures EXAM: RIGHT FOREARM - 2 VIEW COMPARISON:  Study obtained earlier in the day. FINDINGS: Frontal and lateral views were obtained with the forearm in immobilization device. There is an obliquely oriented fracture at the junction mid and distal thirds of the radius as well as a transversely oriented fracture of the distal ulna. The ulnar fracture is in essentially anatomic alignment. There is mild dorsal displacement of the distal radial fracture fragment with respect to the proximal fragment. There is also slight lateral displacement of the distal radius fragment with respect to the proximal fragment. The overlapping of fracture fragments involving the radius is no longer apparent. No new fractures. No dislocations. Joint spaces appear normal. IMPRESSION: Fracture distal ulna in essentially anatomic alignment. Mild dorsal and lateral displacement distal radial fracture fragment with respect to the proximal fragment. There is no longer appreciable overlapping of radial fracture fragments. No new fracture. No dislocation. No appreciable arthropathic change. Electronically Signed   By: Bretta BangWilliam  Woodruff III M.D.   On: 12/04/2016 08:02   Dg Forearm Right  Result Date: 12/04/2016 CLINICAL DATA:  46 year old female with fall and right forearm pain. EXAM: RIGHT FOREARM - 2 VIEW COMPARISON:  None. FINDINGS: There is a displaced and angulated transverse fracture of the distal third of the radial diaphysis with approximately 1.5 cm overlap. There is a nondisplaced mildly angulated transverse fracture of the distal third of the ulnar diaphysis. The bones are well mineralized. No arthritic changes. There is no dislocation. There is soft tissue swelling of the distal forearm. No radiopaque foreign object. IMPRESSION: Fractures of  the distal third of the radial and ulnar diaphysis as described. Electronically Signed   By: Elgie CollardArash  Radparvar M.D.   On: 12/04/2016 02:26    Procedures Procedures (including critical care time) Reduction of fracture Date/Time: 8:31 AM Performed by: Demetrios LollKenneth [place] Authorized by: Demetrios LollKenneth Caleigh Rabelo Consent: Verbal and written consent obtained. Risks and benefits: risks, benefits and alternatives were discussed Consent given by: patient Required items: required blood products, implants, devices, and special equipment available Time out: Immediately prior to procedure a "time out" was called to verify the correct patient, procedure, equipment, support staff and site/side marked as required.  Patient sedated: propofol  Vitals: Vital signs were monitored during sedation.  traction and countertraction was performed by myself and Dr. Lynelle DoctorKnapp with improvement in her deformity. Splint was placed.    Initial Impression / Assessment and Plan / ED Course  I have reviewed the triage vital signs and the nursing notes.  Pertinent labs & imaging results that were available during my care of the patient were reviewed by me and considered in my medical decision making (see chart for details).     Patient presented to the ED with right forearm pain after mechanical fall prior to arrival. X-ray reveals a distal angulated and displaced radial fracture with a nondisplaced  distal ulnar fracture. Patient was neurovascularly intact. The case was discussed with Dr. Melvyn Novasrtmann with hand surgery who recommends reduction, splinting, follow-up in the outpatient setting in his office.   Manual reduction was performed by myself and Dr. Lynelle DoctorKnapp. Please see her noted for sedation order. Patient tolerated procedure without difficulties. She had significant improvement in her pain after reduction. Arm was placed in a sugar tong splint with sling application. Patient remains neurovascularly intact. Postreduction x-rays were  performed that showed improvement in alignment. Have given pain medicine. Will need close follow-up with Dr. Glenna Durandrtmann's office.  The patient was discussed and seen by my attending Dr. Lynelle DoctorKnapp.  On discharge patient is much improved and tolerating by mouth fluids and food. Have given her strict return precautions for signs and symptoms of compartment syndrome.    Final Clinical Impressions(s) / ED Diagnoses   Final diagnoses:  Closed fracture of distal end of right radius, unspecified fracture morphology, initial encounter  Closed fracture of distal end of right ulna, unspecified fracture morphology, initial encounter    New Prescriptions New Prescriptions   HYDROCODONE-ACETAMINOPHEN (NORCO/VICODIN) 5-325 MG TABLET    Take 1-2 tablets by mouth every 4 (four) hours as needed.     Rise MuLeaphart, Ceazia Harb T, PA-C 12/04/16 [phone]    Devoria AlbeKnapp, Iva, MD 12/05/16 [phone]

## 2016-12-04 NOTE — H&P (Signed)
Megan Austin is an 46 y.o. female.   Chief Complaint: CLOSED NONDISPLACED TRANSVERSE FRACTURE OF SHAFT OF RIGHT RADIUS CLOSED NONDISPLACED TRANSVERSE FRACTURE OF SHAFT OF RIGHT ULNA  HPI: Megan Austin IS A 46 Y/O RIGHT HAND DOMINANT FEMALE WHO INJURED HER RIGHT FOREARM WHEN SHE FELL UP THE STAIRS ON 12/03/16.  SHE WAS SEEN AT THE EMERGENCY DEPARTMENT WHERE THEY REDUCED THE FRACTURE AND PUT THE PATIENT INTO A SPLINT. SHE FOLLOWED UP IN THE OFFICE FOR FURTHER EVALUATION. DISCUSSED THE REASON AND RATIONALE FOR SURGICAL INTERVENTION. DISCUSSED THE SURGICAL PROCEDURE, INCLUDING THE RISKS VERSUS BENEFITS, AND THE POST-OPERATIVE RECOVERY PROCESS.  THE PATIENT IS HERE TODAY FOR SURGERY.   Past Medical History:  Diagnosis Date  . Allergy   . Diabetes mellitus without complication (HCC)     No past surgical history on file.  No family history on file. Social History:  reports that she has never smoked. She has never used smokeless tobacco. She reports that she does not drink alcohol or use drugs.  Allergies:  Allergies  Allergen Reactions  . Amoxicillin Hives  . Ceclor [Cefaclor] Hives  . Keflex [Cephalexin] Hives  . Lorabid [Loracarbef] Hives  . Septra [Sulfamethoxazole-Trimethoprim] Hives    No prescriptions prior to admission.    No results found for this or any previous visit (from the past 48 hour(s)). Dg Forearm Right  Result Date: 12/04/2016 CLINICAL DATA:  Postreduction for forearm fractures EXAM: RIGHT FOREARM - 2 VIEW COMPARISON:  Study obtained earlier in the day. FINDINGS: Frontal and lateral views were obtained with the forearm in immobilization device. There is an obliquely oriented fracture at the junction mid and distal thirds of the radius as well as a transversely oriented fracture of the distal ulna. The ulnar fracture is in essentially anatomic alignment. There is mild dorsal displacement of the distal radial fracture fragment with respect to the proximal fragment. There is  also slight lateral displacement of the distal radius fragment with respect to the proximal fragment. The overlapping of fracture fragments involving the radius is no longer apparent. No new fractures. No dislocations. Joint spaces appear normal. IMPRESSION: Fracture distal ulna in essentially anatomic alignment. Mild dorsal and lateral displacement distal radial fracture fragment with respect to the proximal fragment. There is no longer appreciable overlapping of radial fracture fragments. No new fracture. No dislocation. No appreciable arthropathic change. Electronically Signed   By: Bretta BangWilliam  Woodruff III M.D.   On: 12/04/2016 08:02   Dg Forearm Right  Result Date: 12/04/2016 CLINICAL DATA:  46 year old female with fall and right forearm pain. EXAM: RIGHT FOREARM - 2 VIEW COMPARISON:  None. FINDINGS: There is a displaced and angulated transverse fracture of the distal third of the radial diaphysis with approximately 1.5 cm overlap. There is a nondisplaced mildly angulated transverse fracture of the distal third of the ulnar diaphysis. The bones are well mineralized. No arthritic changes. There is no dislocation. There is soft tissue swelling of the distal forearm. No radiopaque foreign object. IMPRESSION: Fractures of the distal third of the radial and ulnar diaphysis as described. Electronically Signed   By: Elgie CollardArash  Radparvar M.D.   On: 12/04/2016 02:26    ROS NO RECENT ILLNESSES OR HOSPITALIZATIONS  Last menstrual period 11/06/2016. Physical Exam  General Appearance:  Alert, cooperative, no distress, appears stated age  Head:  Normocephalic, without obvious abnormality, atraumatic  Eyes:  Pupils equal, conjunctiva/corneas clear,         Throat: Lips, mucosa, and tongue normal; teeth and gums normal  Neck: No visible masses     Lungs:   respirations unlabored  Chest Wall:  No tenderness or deformity  Heart:  Regular rate and rhythm,  Abdomen:   Soft, non-tender,         Extremities: RUE:  SPLINT IN PLACE FINGERS WARM WELL PERFUSED ABLE TO EXTEND THUMB AND EXTEND DIGITS  Pulses: 2+ and symmetric  Skin: Skin color, texture, turgor normal, no rashes or lesions     Neurologic: Normal    Assessment CLOSED DISPLACED TRANSVERSE FRACTURE OF SHAFT OF RIGHT RADIUS CLOSED MINIMALLY DISPLACED TRANSVERSE FRACTURE OF SHAFT OF RIGHT ULNA  Plan RIGHT OPEN REDUCTION AND INTERNAL FIXATION WITH REPAIR AS INDICATED OF THE RIGHT RADIAL SHAFT AND ULNAR SHAFT  R/B/A DISCUSSED WITH PT IN OFFICE.  PT VOICED UNDERSTANDING OF PLAN CONSENT SIGNED DAY OF SURGERY PT SEEN AND EXAMINED PRIOR TO OPERATIVE PROCEDURE/DAY OF SURGERY SITE MARKED. QUESTIONS ANSWERED WILL GO HOME FOLLOWING SURGERY  WE ARE PLANNING SURGERY FOR YOUR UPPER EXTREMITY. THE RISKS AND BENEFITS OF SURGERY INCLUDE BUT NOT LIMITED TO BLEEDING INFECTION, DAMAGE TO NEARBY NERVES ARTERIES TENDONS, FAILURE OF SURGERY TO ACCOMPLISH ITS INTENDED GOALS, PERSISTENT SYMPTOMS AND NEED FOR FURTHER SURGICAL INTERVENTION. WITH THIS IN MIND WE WILL PROCEED. I HAVE DISCUSSED WITH THE PATIENT THE PRE AND POSTOPERATIVE REGIMEN AND THE DOS AND DON'TS. PT VOICED UNDERSTANDING AND INFORMED CONSENT SIGNED.  Karma GreaserSamantha Bonham Barton 12/04/2016, 3:06 PM

## 2016-12-04 NOTE — ED Notes (Signed)
ED Provider at bedside. 

## 2016-12-04 NOTE — ED Triage Notes (Signed)
Pt reports that she fell running up the stairs and fell on her right arm.  There appears to be some disfiguration and it is causing her great pain.  Denies blood thinners, LOC and any other symptoms at this time.

## 2016-12-05 ENCOUNTER — Encounter (HOSPITAL_COMMUNITY)
Admission: RE | Disposition: A | Discharge status: Home / Self Care | Payer: Self-pay | Source: Ambulatory Visit | Attending: Orthopedic Surgery

## 2016-12-05 ENCOUNTER — Ambulatory Visit (HOSPITAL_COMMUNITY): Payer: Medicaid Other | Admitting: Anesthesiology

## 2016-12-05 ENCOUNTER — Ambulatory Visit (HOSPITAL_COMMUNITY)
Admission: RE | Admit: 2016-12-05 | Discharge: 2016-12-05 | Disposition: A | Discharge status: Home / Self Care | Payer: Medicaid Other | Source: Ambulatory Visit | Attending: Orthopedic Surgery | Admitting: Orthopedic Surgery

## 2016-12-05 ENCOUNTER — Encounter (HOSPITAL_COMMUNITY): Payer: Self-pay | Admitting: Orthopedic Surgery

## 2016-12-05 DIAGNOSIS — Z881 Allergy status to other antibiotic agents status: Secondary | ICD-10-CM | POA: Insufficient documentation

## 2016-12-05 DIAGNOSIS — S52301A Unspecified fracture of shaft of right radius, initial encounter for closed fracture: Secondary | ICD-10-CM

## 2016-12-05 DIAGNOSIS — W108XXA Fall (on) (from) other stairs and steps, initial encounter: Secondary | ICD-10-CM | POA: Insufficient documentation

## 2016-12-05 DIAGNOSIS — S52391A Other fracture of shaft of radius, right arm, initial encounter for closed fracture: Secondary | ICD-10-CM | POA: Diagnosis present

## 2016-12-05 DIAGNOSIS — E119 Type 2 diabetes mellitus without complications: Secondary | ICD-10-CM | POA: Diagnosis not present

## 2016-12-05 DIAGNOSIS — Z87891 Personal history of nicotine dependence: Secondary | ICD-10-CM | POA: Diagnosis not present

## 2016-12-05 DIAGNOSIS — Z888 Allergy status to other drugs, medicaments and biological substances status: Secondary | ICD-10-CM | POA: Insufficient documentation

## 2016-12-05 DIAGNOSIS — S52291A Other fracture of shaft of right ulna, initial encounter for closed fracture: Secondary | ICD-10-CM | POA: Insufficient documentation

## 2016-12-05 DIAGNOSIS — S52201A Unspecified fracture of shaft of right ulna, initial encounter for closed fracture: Secondary | ICD-10-CM

## 2016-12-05 DIAGNOSIS — Z880 Allergy status to penicillin: Secondary | ICD-10-CM | POA: Insufficient documentation

## 2016-12-05 HISTORY — DX: Unspecified osteoarthritis, unspecified site: M19.90

## 2016-12-05 HISTORY — PX: ORIF RADIAL FRACTURE: SHX5113

## 2016-12-05 HISTORY — DX: Personal history of other diseases of the nervous system and sense organs: Z86.69

## 2016-12-05 LAB — CBC
HEMATOCRIT: 41 % (ref 36.0–46.0)
HEMOGLOBIN: 13.4 g/dL (ref 12.0–15.0)
MCH: 29.9 pg (ref 26.0–34.0)
MCHC: 32.7 g/dL (ref 30.0–36.0)
MCV: 91.5 fL (ref 78.0–100.0)
Platelets: 349 10*3/uL (ref 150–400)
RBC: 4.48 MIL/uL (ref 3.87–5.11)
RDW: 14.2 % (ref 11.5–15.5)
WBC: 14.9 10*3/uL — ABNORMAL HIGH (ref 4.0–10.5)

## 2016-12-05 LAB — BASIC METABOLIC PANEL
ANION GAP: 7 (ref 5–15)
BUN: 7 mg/dL (ref 6–20)
CHLORIDE: 107 mmol/L (ref 101–111)
CO2: 24 mmol/L (ref 22–32)
CREATININE: 0.75 mg/dL (ref 0.44–1.00)
Calcium: 9 mg/dL (ref 8.9–10.3)
GFR calc non Af Amer: >60 mL/min (ref >60)
Glucose, Bld: 103 mg/dL — ABNORMAL HIGH (ref 65–99)
POTASSIUM: 3.9 mmol/L (ref 3.5–5.1)
SODIUM: 138 mmol/L (ref 135–145)

## 2016-12-05 LAB — GLUCOSE, CAPILLARY: Glucose-Capillary: 115 mg/dL — ABNORMAL HIGH (ref 65–99)

## 2016-12-05 LAB — HCG, SERUM, QUALITATIVE: Preg, Serum: NEGATIVE

## 2016-12-05 SURGERY — OPEN REDUCTION INTERNAL FIXATION (ORIF) RADIAL FRACTURE
Anesthesia: General | Site: Wrist | Laterality: Right

## 2016-12-05 MED ORDER — DEXAMETHASONE SODIUM PHOSPHATE 4 MG/ML IJ SOLN
INTRAMUSCULAR | Status: DC | PRN
  Administered 2016-12-05: 5 mg via INTRAVENOUS

## 2016-12-05 MED ORDER — PROPOFOL 10 MG/ML IV BOLUS
INTRAVENOUS | Status: AC
  Filled 2016-12-05: qty 20

## 2016-12-05 MED ORDER — PROPOFOL 10 MG/ML IV BOLUS
INTRAVENOUS | Status: DC | PRN
  Administered 2016-12-05: 200 mg via INTRAVENOUS

## 2016-12-05 MED ORDER — CLINDAMYCIN PHOSPHATE 900 MG/50ML IV SOLN
900.0000 mg | INTRAVENOUS | Status: AC
  Administered 2016-12-05: 900 mg via INTRAVENOUS
  Filled 2016-12-05: qty 50

## 2016-12-05 MED ORDER — MIDAZOLAM HCL 2 MG/2ML IJ SOLN
INTRAMUSCULAR | Status: AC
  Filled 2016-12-05: qty 2

## 2016-12-05 MED ORDER — HYDROMORPHONE HCL 1 MG/ML IJ SOLN: 0.2500 mg | INTRAMUSCULAR | Status: DC | PRN

## 2016-12-05 MED ORDER — LACTATED RINGERS IV SOLN
INTRAVENOUS | Status: DC
  Administered 2016-12-05: 14:00:00 via INTRAVENOUS

## 2016-12-05 MED ORDER — PHENYLEPHRINE 40 MCG/ML (10ML) SYRINGE FOR IV PUSH (FOR BLOOD PRESSURE SUPPORT)
PREFILLED_SYRINGE | INTRAVENOUS | Status: DC | PRN
  Administered 2016-12-05 (×2): 80 ug via INTRAVENOUS
  Administered 2016-12-05: 40 ug via INTRAVENOUS
  Administered 2016-12-05: 80 ug via INTRAVENOUS

## 2016-12-05 MED ORDER — 0.9 % SODIUM CHLORIDE (POUR BTL) OPTIME
TOPICAL | Status: DC | PRN
  Administered 2016-12-05: 1000 mL

## 2016-12-05 MED ORDER — FENTANYL CITRATE (PF) 100 MCG/2ML IJ SOLN
INTRAMUSCULAR | Status: AC
  Filled 2016-12-05: qty 2

## 2016-12-05 MED ORDER — LACTATED RINGERS IV SOLN
INTRAVENOUS | Status: DC | PRN
  Administered 2016-12-05 (×2): via INTRAVENOUS

## 2016-12-05 MED ORDER — ONDANSETRON HCL 4 MG/2ML IJ SOLN
INTRAMUSCULAR | Status: DC | PRN
  Administered 2016-12-05: 4 mg via INTRAVENOUS

## 2016-12-05 MED ORDER — FENTANYL CITRATE (PF) 100 MCG/2ML IJ SOLN
INTRAMUSCULAR | Status: DC | PRN
  Administered 2016-12-05 (×4): 50 ug via INTRAVENOUS

## 2016-12-05 MED ORDER — BUPIVACAINE-EPINEPHRINE (PF) 0.5% -1:200000 IJ SOLN
INTRAMUSCULAR | Status: DC | PRN
  Administered 2016-12-05: 25 mL via PERINEURAL

## 2016-12-05 MED ORDER — MIDAZOLAM HCL 5 MG/5ML IJ SOLN
INTRAMUSCULAR | Status: DC | PRN
Start: 2016-12-05 — End: 2016-12-05
  Administered 2016-12-05 (×2): 1 mg via INTRAVENOUS

## 2016-12-05 MED ORDER — CHLORHEXIDINE GLUCONATE 4 % EX LIQD: 60.0000 mL | Freq: Once | CUTANEOUS | Status: DC

## 2016-12-05 MED ORDER — LIDOCAINE 2% (20 MG/ML) 5 ML SYRINGE
INTRAMUSCULAR | Status: DC | PRN
  Administered 2016-12-05: 70 mg via INTRAVENOUS

## 2016-12-05 MED ORDER — FENTANYL CITRATE (PF) 250 MCG/5ML IJ SOLN
INTRAMUSCULAR | Status: AC
  Filled 2016-12-05: qty 5

## 2016-12-05 SURGICAL SUPPLY — 59 items
BANDAGE ACE 3X5.8 VEL STRL LF (GAUZE/BANDAGES/DRESSINGS) ×3 IMPLANT
BANDAGE ACE 4X5 VEL STRL LF (GAUZE/BANDAGES/DRESSINGS) ×3 IMPLANT
BANDAGE ELASTIC 3 VELCRO ST LF (GAUZE/BANDAGES/DRESSINGS) ×3 IMPLANT
BANDAGE ELASTIC 4 VELCRO ST LF (GAUZE/BANDAGES/DRESSINGS) ×3 IMPLANT
BIT DRILL STD 2.0MM (DRILL) ×1 IMPLANT
BLADE CLIPPER SURG (BLADE) IMPLANT
BNDG ESMARK 4X9 LF (GAUZE/BANDAGES/DRESSINGS) ×3 IMPLANT
BNDG GAUZE ELAST 4 BULKY (GAUZE/BANDAGES/DRESSINGS) ×3 IMPLANT
CANISTER SUCT 3000ML PPV (MISCELLANEOUS) ×3 IMPLANT
CORDS BIPOLAR (ELECTRODE) ×3 IMPLANT
COVER SURGICAL LIGHT HANDLE (MISCELLANEOUS) ×3 IMPLANT
CUFF TOURNIQUET SINGLE 18IN (TOURNIQUET CUFF) ×3 IMPLANT
CUFF TOURNIQUET SINGLE 24IN (TOURNIQUET CUFF) IMPLANT
DECANTER SPIKE VIAL GLASS SM (MISCELLANEOUS) IMPLANT
DRAPE OEC MINIVIEW 54X84 (DRAPES) ×3 IMPLANT
DRAPE SURG 17X11 SM STRL (DRAPES) ×3 IMPLANT
DRILL STANDARD 2.0MM (DRILL) ×3
DRSG ADAPTIC 3X8 NADH LF (GAUZE/BANDAGES/DRESSINGS) ×3 IMPLANT
GAUZE SPONGE 4X4 12PLY STRL (GAUZE/BANDAGES/DRESSINGS) ×3 IMPLANT
GAUZE SPONGE 4X4 16PLY XRAY LF (GAUZE/BANDAGES/DRESSINGS) ×3 IMPLANT
GLOVE BIOGEL PI IND STRL 8.5 (GLOVE) ×1 IMPLANT
GLOVE BIOGEL PI INDICATOR 8.5 (GLOVE) ×2
GLOVE SURG ORTHO 8.0 STRL STRW (GLOVE) ×3 IMPLANT
GOWN STRL REUS W/ TWL LRG LVL3 (GOWN DISPOSABLE) ×2 IMPLANT
GOWN STRL REUS W/ TWL XL LVL3 (GOWN DISPOSABLE) ×1 IMPLANT
GOWN STRL REUS W/TWL LRG LVL3 (GOWN DISPOSABLE) ×4
GOWN STRL REUS W/TWL XL LVL3 (GOWN DISPOSABLE) ×2
KIT BASIN OR (CUSTOM PROCEDURE TRAY) ×3 IMPLANT
KIT ROOM TURNOVER OR (KITS) ×3 IMPLANT
NEEDLE HYPO 25X1 1.5 SAFETY (NEEDLE) IMPLANT
NS IRRIG 1000ML POUR BTL (IV SOLUTION) ×3 IMPLANT
PACK ORTHO EXTREMITY (CUSTOM PROCEDURE TRAY) ×3 IMPLANT
PAD ARMBOARD 7.5X6 YLW CONV (MISCELLANEOUS) ×3 IMPLANT
PAD CAST 3X4 CTTN HI CHSV (CAST SUPPLIES) ×1 IMPLANT
PAD CAST 4YDX4 CTTN HI CHSV (CAST SUPPLIES) ×1 IMPLANT
PADDING CAST COTTON 3X4 STRL (CAST SUPPLIES) ×2
PADDING CAST COTTON 4X4 STRL (CAST SUPPLIES) ×2
PLATE COMP 6HOLE 2.7MM (Plate) ×3 IMPLANT
PLATE COMP DUAL 7HOLE 2.7 (Plate) ×3 IMPLANT
SCREW 2.7X16MM (Screw) ×6 IMPLANT
SCREW CORT 2.5X12X2.7XST SM (Screw) ×3 IMPLANT
SCREW CORTICAL 2.7X12 (Screw) ×6 IMPLANT
SCREW CORTICAL 2.7X14MM (Screw) ×21 IMPLANT
SCREW NLOCK CORT 2.7X16 NS (Screw) ×3 IMPLANT
SOAP 2 % CHG 4 OZ (WOUND CARE) ×3 IMPLANT
SPLINT FIBERGLASS 3X35 (CAST SUPPLIES) ×3 IMPLANT
SPONGE LAP 4X18 X RAY DECT (DISPOSABLE) ×3 IMPLANT
SUCTION FRAZIER HANDLE 10FR (MISCELLANEOUS) ×2
SUCTION TUBE FRAZIER 10FR DISP (MISCELLANEOUS) ×1 IMPLANT
SUT PROLENE 4 0 PS 2 18 (SUTURE) ×12 IMPLANT
SUT VIC AB 2-0 FS1 27 (SUTURE) ×3 IMPLANT
SUT VICRYL 4-0 PS2 18IN ABS (SUTURE) ×6 IMPLANT
SYR CONTROL 10ML LL (SYRINGE) IMPLANT
TOWEL OR 17X24 6PK STRL BLUE (TOWEL DISPOSABLE) ×3 IMPLANT
TOWEL OR 17X26 10 PK STRL BLUE (TOWEL DISPOSABLE) ×3 IMPLANT
TUBE CONNECTING 12'X1/4 (SUCTIONS) ×1
TUBE CONNECTING 12X1/4 (SUCTIONS) ×2 IMPLANT
WATER STERILE IRR 1000ML POUR (IV SOLUTION) IMPLANT
YANKAUER SUCT BULB TIP NO VENT (SUCTIONS) IMPLANT

## 2016-12-05 NOTE — Discharge Instructions (Signed)
KEEP BANDAGE CLEAN AND DRY CALL OFFICE FOR F/U APPT [phone] IN 15 DAYS KEEP HAND ELEVATED ABOVE HEART OK TO APPLY ICE TO OPERATIVE AREA CONTACT OFFICE IF ANY WORSENING PAIN OR CONCERNS.  

## 2016-12-05 NOTE — Progress Notes (Signed)
Orthopedic Tech Progress Note Patient Details:  Megan Austin 13-Dec-1970 [id number]  Ortho Devices Type of Ortho Device: Arm sling Ortho Device/Splint Location: RUE Ortho Device/Splint Interventions: Ordered, Application   Megan Austin, Megan Austin 12/05/2016, 7:14 PM

## 2016-12-05 NOTE — Transfer of Care (Signed)
Immediate Anesthesia Transfer of Care Note  Patient: Megan Austin  Procedure(s) Performed: Procedure(s): RIGHT OPEN REDUCTION AND INTERNAL FIXATION AND REPAIR AS INDICATED, RIGHT RADIUS AND ULNA (Right)  Patient Location: PACU  Anesthesia Type:GA combined with regional for post-op pain  Level of Consciousness: awake, alert  and oriented  Airway & Oxygen Therapy: Patient Spontanous Breathing and Patient connected to nasal cannula oxygen  Post-op Assessment: Report given to RN and Post -op Vital signs reviewed and stable  Post vital signs: Reviewed and stable  Last Vitals:  Vitals:   12/05/16 1351  BP: 131/83  Pulse: 81  Resp: 18  Temp: 37.1 C    Last Pain:  Vitals:   12/05/16 1352  TempSrc:   PainSc: 8          Complications: No apparent anesthesia complications

## 2016-12-05 NOTE — Anesthesia Postprocedure Evaluation (Signed)
Anesthesia Post Note  Patient: Megan Austin  Procedure(s) Performed: Procedure(s) (LRB): RIGHT OPEN REDUCTION AND INTERNAL FIXATION AND REPAIR AS INDICATED, RIGHT RADIUS AND ULNA (Right)     Patient location during evaluation: PACU Anesthesia Type: General Level of consciousness: awake and alert and patient cooperative Pain management: pain level controlled Vital Signs Assessment: post-procedure vital signs reviewed and stable Respiratory status: spontaneous breathing and respiratory function stable Cardiovascular status: stable Anesthetic complications: no    Last Vitals:  Vitals:   12/05/16 1915 12/05/16 1917  BP:  133/86  Pulse: 86 79  Resp: 17 (!) 23  Temp:  (!) 36.4 C    Last Pain:  Vitals:   12/05/16 1917  TempSrc:   PainSc: 0-No pain                 Quashon Jesus S

## 2016-12-05 NOTE — Anesthesia Preprocedure Evaluation (Signed)
Anesthesia Evaluation  Patient identified by MRN, date of birth, ID band Patient awake    Reviewed: Allergy & Precautions, NPO status , Patient's Chart, lab work & pertinent test results  Airway Mallampati: II  TM Distance: >3 FB     Dental   Pulmonary former smoker,    breath sounds clear to auscultation       Cardiovascular negative cardio ROS   Rhythm:Regular Rate:Normal     Neuro/Psych    GI/Hepatic negative GI ROS, Neg liver ROS,   Endo/Other  diabetes  Renal/GU negative Renal ROS     Musculoskeletal   Abdominal   Peds  Hematology   Anesthesia Other Findings   Reproductive/Obstetrics                             Anesthesia Physical Anesthesia Plan  ASA: I  Anesthesia Plan: General   Post-op Pain Management:    Induction: Intravenous  PONV Risk Score and Plan: 3 and Ondansetron, Dexamethasone, Midazolam and Propofol infusion  Airway Management Planned: Oral ETT  Additional Equipment:   Intra-op Plan:   Post-operative Plan: Extubation in OR  Informed Consent:   Dental advisory given  Plan Discussed with: CRNA and Anesthesiologist  Anesthesia Plan Comments:         Anesthesia Quick Evaluation

## 2016-12-05 NOTE — Anesthesia Procedure Notes (Signed)
Procedure Name: LMA Insertion Date/Time: 12/05/2016 4:51 PM Performed by: [name], [name] Pre-anesthesia Checklist: Patient identified, Emergency Drugs available, Suction available and Patient being monitored Patient Re-evaluated:Patient Re-evaluated prior to induction Oxygen Delivery Method: Circle System Utilized Preoxygenation: Pre-oxygenation with 100% oxygen Induction Type: IV induction Ventilation: Mask ventilation without difficulty LMA: LMA inserted LMA Size: 4.0 Number of attempts: 1 Airway Equipment and Method: Bite block Placement Confirmation: positive ETCO2 Tube secured with: Tape Dental Injury: Teeth and Oropharynx as per pre-operative assessment

## 2016-12-05 NOTE — Anesthesia Procedure Notes (Addendum)
Anesthesia Regional Block: Supraclavicular block   Pre-Anesthetic Checklist: ,, timeout performed, Correct Patient, Correct Site, Correct Laterality, Correct Procedure, Correct Position, site marked, Risks and benefits discussed,  Surgical consent,  Pre-op evaluation,  At surgeon's request and post-op pain management  Laterality: Right and Upper  Prep: chloraprep       Needles:   Needle Type: Echogenic Stimulator Needle     Needle Length: 9cm  Needle Gauge: 21   Needle insertion depth: 5 cm   Additional Needles:   Procedures: ultrasound guided,,,,,,,,  Narrative:  Start time: 12/05/2016 4:15 PM End time: 12/05/2016 4:35 PM Injection made incrementally with aspirations every 5 mL.  Performed by: Personally  Anesthesiologist: Tayten Heber

## 2016-12-05 NOTE — Op Note (Signed)
PREOPERATIVE DIAGNOSIS: Right radial and ulnar shaft fractures  POSTOPERATIVE DIAGNOSIS: Same  ATTENDING SURGEON: Dr. Bradly BienenstockFred Atleigh Gruen  ASSISTANT SURGEON: Lambert ModySamantha Barton PA-C who was scrubbed and present in necessary for the reduction internal fixation closure and splinting  ANESTHESIA: Gen. via LMA  OPERATIVE PROCEDURE: Open treatment of right radial ulnar shaft fractures requiring internal fixation right forearm Radiographs 3 views right forearm  IMPLANTS: Zimmer stainless steel 7-hole plate for the radial shaft with 2.7 mm screws compression plating, Zimmer 2.7 mm stainless steel 6-hole plate  RADIOGRAPHIC INTERPRETATION: AP lateral and oblique views of the forearm show the internal fixation and good position good alignment of the wrist/ forearm  SURGICAL INDICATIONS: Megan Austin is a right-hand-dominant female who sustained a closed both bone forearm fracture. Patient was seen and evaluated in the office and recommended undergo the above procedure. Risks benefits and alternatives were discussed in detail with the patient in a signed informed consent was obtained. Risks of surgery include but not limited to bleeding infection damage to nearby nerves arteries or tendons nonunion malunion hardware failure and need for further surgical intervention  SURGICAL TECHNIQUE: Patient was properly identified in the preoperative holding area and a mark with a permanent marker made on the right performed indicate the correct operative site. Patient is then brought back to the operating room placed supine on anesthesia and table where general anesthesia was administered. Patient tolerated this well. A well-padded tourniquet was then placed on the right brachium and sealed with the appropriate drape. The right upper extremities and prepped and draped in normal sterile fashion. A timeout was called the correct site I was identified and the procedure then begun. Preoperative antibiotics were given prior any skin  incision. A longitudinal incision made directly over the radial shaft. Dissection carried down through the skin and subcutaneous tissue. The FCR sheath was opened. Blunt dissection carried down to the fracture site where the fracture hematoma was then evacuated. Reduction clamps were then placed an open reduction was then performed of the radial shaft. Following this the 7 hole plate was then contoured with the plate benders and held in place with a reduction clamp. This was confirmed with the mini C-arm. After reduction confirmed compression plating technique was then carried out with a 2.7 mm screws. 6 cortices were obtained above and below the fracture site. The wound was then thoroughly irrigated. Radiographs were then obtained. Subcutaneous tissues closed with Vicryl and the skin closed with 4-0 Prolene suture. Attention was then turned through a separate incision to the ulnar shaft. Longitudinal incision made directly over the ulnar shaft. Dissection carried down through skin subtendinous tissue. The fascial layer incised longitudinally. The fracture site was then open fracture hematoma evacuated open reduction was then carried out and the 6-hole plate was then applied. Was held with a reduction clamp compression plating technique was then used. 3 screws proximally 3 screws distally with a 2.0 mm drill bit and the 2.7 mm nonlocking screws. The wound was then irrigated. Final radiographs were then obtained. The fascial layer closed with 2-0 Vicryl subcutaneous tissues closed with 4-0 Vicryl and skin closed with 4-0 Prolene. Adaptic dressing sterile compressive bandage then applied. Patient tolerated the procedure well placed in a well-padded sugar tong splint accident taken recovery room in good condition.  POSTOPERATIVE PLAN: Patient is be discharged to home seen back in the office in approximately 2 weeks for wound check suture removal x-rays application of a short arm cast for a total of 3 weeks then [facility]  [facility]. Radiographs at each visit. Right the therapy prescription for [facility] therapy at the first postoperative visit.

## 2016-12-06 ENCOUNTER — Encounter (HOSPITAL_COMMUNITY): Payer: Self-pay | Admitting: Orthopedic Surgery

## 2017-01-10 ENCOUNTER — Ambulatory Visit: Payer: Medicaid Other | Attending: Orthopedic Surgery | Admitting: Occupational Therapy

## 2017-01-10 DIAGNOSIS — M25631 Stiffness of right wrist, not elsewhere classified: Secondary | ICD-10-CM | POA: Diagnosis present

## 2017-01-10 DIAGNOSIS — M6281 Muscle weakness (generalized): Secondary | ICD-10-CM | POA: Insufficient documentation

## 2017-01-10 NOTE — Patient Instructions (Signed)
  WEARING SCHEDULE:  Wear splint at ALL times except for hygiene care (May remove splint for exercises and then immediately place back on ONLY if directed by the therapist)  PURPOSE:  To prevent movement and for protection until injury can heal  CARE OF SPLINT:  Keep splint away from heat sources including: stove, radiator or furnace, or a car in sunlight. The splint can melt and will no longer fit you properly  Keep away from pets and children  Clean the splint with rubbing alcohol 1-2 times per day.  * During this time, make sure you also clean your hand/arm as instructed by your therapist and/or perform dressing changes as needed. Then dry hand/arm completely before replacing splint. (When cleaning hand/arm, keep it immobilized in same position until splint is replaced)  PRECAUTIONS/POTENTIAL PROBLEMS: *If you notice or experience increased pain, swelling, numbness, or a lingering reddened area from the splint: Contact your therapist immediately by calling [phone]. You must wear the splint for protection, but we will get you scheduled for adjustments as quickly as possible.  (If only straps or hooks need to be replaced and NO adjustments to the splint need to be made, just call the office ahead and let them know you are coming in)  If you have any medical concerns or signs of infection, please call your doctor immediately   AROM: Wrist Extension    With right palm down, bend wrist up. Repeat _15___ times per set. Do ___1_ sets per session. Do __4__ sessions per day.    AROM: Wrist Flexion    With right palm up, bend wrist up. Repeat __15__ times per set. Do __1__ sets per session. Do __4__ sessions per day.  AROM: Wrist Radial / Ulnar Deviation    Gently bend left wrist from side to side as far as possible. Repeat __15__ times per set. Do __1__ sets per session. Do _4___ sessions per day.  Combination Movement (Active)    Keep elbow firmly at side with wrist  straight. Turn palm up and down  Repeat _15___ times. Do __4__ sessions per day.

## 2017-01-10 NOTE — Therapy (Signed)
[facility] [address] [place], [place], [address] Phone: [phone]   Fax:  [phone]  Occupational Therapy Evaluation  Patient Details  Name: Megan Austin MRN: [id number] Date of Birth: 04/30/1971 Referring Provider: Dr. Melvyn Novasrtmann  Encounter Date: 01/10/2017      OT End of Session - 01/10/17 1207    Visit Number 1   Number of Visits 4   Authorization Type MCD - awaiting authorization   OT Start Time 1030   OT Stop Time 1130   OT Time Calculation (min) 60 min   Activity Tolerance Patient tolerated treatment well      Past Medical History:  Diagnosis Date  . Allergy   . Arthritis   . Diabetes mellitus without complication (HCC)    gestional  . Hx of Bell's palsy 2013   left eye- remains blurred    Past Surgical History:  Procedure Laterality Date  . DENTAL SURGERY    . MOUTH SURGERY    . ORIF RADIAL FRACTURE Right 12/05/2016   Procedure: RIGHT OPEN REDUCTION AND INTERNAL FIXATION AND REPAIR AS INDICATED, RIGHT RADIUS AND ULNA;  Surgeon: Bradly Bienenstockrtmann, Fred, MD;  Location: [facility] OR;  Service: Orthopedics;  Laterality: Right;    There were no vitals filed for this visit.      Subjective Assessment - 01/10/17 1033    Patient is accompained by: Family member  husband   Patient Stated Goals Get full ROM and return to normal   Currently in Pain? No/denies           [facility] - 01/10/17 0001      Assessment   Diagnosis s/p ORIF Rt ulna and radius   Referring Provider Dr. Melvyn Novasrtmann   Onset Date 12/05/16   Assessment Pt arrived unprotected straight from MD office. Pt reports MD had just removed cast. Also noted different last name in EPIC vs. referral from MD - however therapist confirmed this is the same person and that "Gerstner" is still official last name, but "Megan Austin" is her married name which has not been officially changed.    Prior Therapy none     Precautions   Precautions Other (comment)   Precaution Comments Begin A/ROM at 3 weeks from referral date (which is now ok). 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 Mod I for some IADLS depending on task     Written Expression   Dominant Hand Right   Handwriting Increased time     Edema   Edema very mild Rt forearm and thumb     ROM / Strength   AROM / PROM / Strength AROM     AROM   Overall AROM Comments Rt forearm/wrist: supination/pronation both 85*, wrist flex = 50, ext = 48, RD = 25, UD = 40                  OT Treatments/Exercises (OP) - 01/10/17 0001      ADLs   ADL Comments Cleaned hand/forearm. Reviewed hygiene care and  precautions. Pt issued tensogrip and stockinette     Exercises   Exercises Wrist     Wrist Exercises   Other wrist exercises Pt issued A/ROM HEP for forearm/wrist - see pt instructions for details     Splinting   Splinting Fabricated and fitted forearm clamshell splint to cross wrist per MD orders. Issued splint and reviewed wear and care               OT Education - 01/10/17 1129    Education provided Yes   Education Details splint wear and care, hygiene care, precautions, A/ROM HEP   Person(s) Educated Patient;Spouse   Methods Explanation;Demonstration;Handout   Comprehension Verbalized understanding;Returned demonstration          OT Short Term Goals - 01/10/17 1215      OT SHORT TERM GOAL #1   Title Independent with splint wear and care   Baseline issued but may need review and/or adjustments   Time 4   Period Weeks   Status New     OT SHORT TERM GOAL #2   Title  Independent with HEP    Baseline Issued, will need updates   Time 4   Period Weeks   Status New           OT Long Term Goals - 01/10/17 1215      OT LONG TERM GOAL #1   Title Independent with strengthening HEP   Baseline Dependent d/t current precautions   Time 8   Period Weeks   Status New     OT LONG TERM GOAL #2   Title Pt to return to using Rt hand as dominant hand for IADLS    Baseline Dependent, using Lt hand d/t precautions with RT   Time 8   Period Weeks   Status New     OT LONG TERM GOAL #3   Title Pt to demo 25 lbs or greater grip strength Rt dominant hand for opening jars/containers   Baseline dependent d/t current precautions   Time 8   Period Weeks   Status New               Plan - 01/10/17 1207    Clinical Impression Statement Pt is a 46 y.o. female who presents to outpatient rehab s/p ORIF right dominant side ulna and radius (40981) with surgery on 12/05/16. Pt presents today with mild edema and stiffness Rt wrist and forearm. Pt has no pain currently   Occupational Profile and client history currently impacting functional performance PMH: OA. Current restrictions limit pts ability to perform all IADLS and work related tasks.    Occupational performance deficits (Please refer to evaluation for details): IADL's;Work;Leisure;Social Participation   Rehab Potential Good   OT Frequency --  3 visits over 8 week duration   OT Treatment/Interventions Self-care/ADL training;Moist Heat;Fluidtherapy;DME and/or AE instruction;Splinting;Patient/family education;Therapeutic exercises;Contrast Bath;Compression bandaging;Therapeutic activities;Ultrasound;Passive range of motion;Parrafin;Electrical Stimulation;Manual Therapy   Plan Check MCD authorization and update, splint adjustments prn, continue A/ROM Rt wrist/forearm, begin P/ROM in 2 weeks (as typical protocol) unless otherwise indicated per MD.    Clinical Decision Making Limited treatment options, no task  modification necessary   Consulted and Agree with Plan of Care Patient;Family member/caregiver   Family Member Consulted Spouse      Patient will benefit from skilled therapeutic intervention in order to improve the following deficits and impairments:  Decreased coordination, Decreased range of motion, Impaired flexibility, Impaired sensation, Increased edema, Impaired UE functional use, Pain, Decreased strength,  Decreased knowledge of precautions  Visit Diagnosis: Stiffness of right wrist, not elsewhere classified - Plan: Ot plan of care cert/re-cert  Stiffness of joint of right forearm - Plan: Ot plan of care cert/re-cert  Muscle weakness (generalized) - Plan: Ot plan of care cert/re-cert    Problem List There are no active problems to display for this patient.   Kelli Churn, OTR/L 01/10/2017, 12:22 PM  [facility] [address] [place], [place], [address] Phone: [phone]   Fax:  [phone]  Name: LATAYSHA VOHRA MRN: [id number] Date of Birth: October 11, 1970

## 2017-01-23 ENCOUNTER — Ambulatory Visit: Payer: Medicaid Other | Admitting: Occupational Therapy

## 2017-02-12 ENCOUNTER — Ambulatory Visit: Payer: Medicaid Other | Attending: Orthopedic Surgery | Admitting: Occupational Therapy

## 2017-02-12 DIAGNOSIS — M6281 Muscle weakness (generalized): Secondary | ICD-10-CM | POA: Diagnosis present

## 2017-02-12 DIAGNOSIS — M25631 Stiffness of right wrist, not elsewhere classified: Secondary | ICD-10-CM | POA: Insufficient documentation

## 2017-02-12 NOTE — Therapy (Signed)
[facility] [address] [place], [place], [address] Phone: [phone]   Fax:  [phone]  Occupational Therapy Treatment  Patient Details  Name: Megan Austin MRN: [id number] Date of Birth: 05-11-1970 Referring Provider: Dr. Caralyn Guile  Encounter Date: 02/12/2017      OT End of Session - 02/12/17 1056    Visit Number 2   Number of Visits 4   Authorization Type MCD - approved 12 visits   Authorization Time Period 01/22/17 - 07/03/17   Authorization - Visit Number 1   Authorization - Number of Visits 12   OT Start Time 7106   OT Stop Time 1100   OT Time Calculation (min) 45 min   Activity Tolerance Patient tolerated treatment well      Past Medical History:  Diagnosis Date  . Allergy   . Arthritis   . Diabetes mellitus without complication ([place])    gestional  . Hx of Bell's palsy 2013   left eye- remains blurred    Past Surgical History:  Procedure Laterality Date  . DENTAL SURGERY    . MOUTH SURGERY    . ORIF RADIAL FRACTURE Right 12/05/2016   Procedure: RIGHT OPEN REDUCTION AND INTERNAL FIXATION AND REPAIR AS INDICATED, RIGHT RADIUS AND ULNA;  Surgeon: Iran Planas, MD;  Location: [place];  Service: Orthopedics;  Laterality: Right;    There were no vitals filed for this visit.      Subjective Assessment - 02/12/17 1016    Subjective  I went to the Dr. this morning and he said I could begin strengthening. I have tenderness but no pain   Patient Stated Goals Get full ROM and return to normal   Currently in Pain? No/denies            [facility] OT Assessment - 02/12/17 0001      Hand Function   Right Hand Grip (lbs) 35 lbs   Left Hand Grip (lbs) 63 lbs                  OT Treatments/Exercises (OP) - 02/12/17 0001      ADLs   ADL Comments Pt now 10 weeks post-op and reports MD cleared her for strengthening this morning at MD appointment      Exercises   Exercises Hand     Wrist  Exercises   Other wrist exercises Pt performed P/ROM and strengthening ex's with 1 lb. weight in wrist flexion and extension - see pt instructions for details. Pt instructed to continue with 1 lb. weight this week, and then if no problems increase to 2 lb. weight next week.    Other wrist exercises Forearm weighted stretch for P/ROM and strengthening with light hammer in sup/pron - see pt instructions for details.      Hand Exercises   Other Hand Exercises Pt also issued putty HEP - see pt instructions for details. Pt issued red putty for home use                OT Education - 02/12/17 1033    Education provided Yes   Education Details P/ROM and strengthening HEP for forearm, wrist and hand   Person(s) Educated Patient   Methods Explanation;Demonstration;Handout   Comprehension Verbalized understanding;Returned demonstration          OT Short Term Goals - 02/12/17 1057      OT SHORT TERM GOAL #1   Title Independent with splint wear and care   Baseline  issued but may need review and/or adjustments   Time 4   Period Weeks   Status Achieved     OT SHORT TERM GOAL #2   Title Independent with HEP    Baseline Issued, will need updates   Time 4   Period Weeks   Status Achieved           OT Long Term Goals - 02/12/17 1057      OT LONG TERM GOAL #1   Title Independent with strengthening HEP   Baseline Dependent d/t current precautions   Time 8   Period Weeks   Status On-going     OT LONG TERM GOAL #2   Title Pt to return to using Rt hand as dominant hand for IADLS    Baseline Dependent, using Lt hand d/t precautions with RT   Time 8   Period Weeks   Status On-going     OT LONG TERM GOAL #3   Title Pt to demo 45 lbs or greater grip strength Rt dominant hand for opening jars/containers   Baseline dependent d/t current precautions   Time 8   Period Weeks   Status Revised  02/12/17: 35 lbs               Plan - 02/12/17 1058    Clinical Impression  Statement Pt met all STG's and approximating LTG's. Pt already met original LTG #3, therefore revised/updated. Pt only tender at ulnar wrist but denies pain. Pt now 10 weeks post-op   Rehab Potential Good   OT Frequency --  3 visits over 8 week duration   OT Treatment/Interventions Self-care/ADL training;Moist Heat;Fluidtherapy;DME and/or AE instruction;Splinting;Patient/family education;Therapeutic exercises;Contrast Bath;Compression bandaging;Therapeutic activities;Ultrasound;Passive range of motion;Parrafin;Electrical Stimulation;Manual Therapy   Plan continue strengthening, try gripper activity and wrist winder   Consulted and Agree with Plan of Care Patient      Patient will benefit from skilled therapeutic intervention in order to improve the following deficits and impairments:  Decreased coordination, Decreased range of motion, Impaired flexibility, Impaired sensation, Increased edema, Impaired UE functional use, Pain, Decreased strength, Decreased knowledge of precautions  Visit Diagnosis: Stiffness of right wrist, not elsewhere classified  Stiffness of joint of right forearm  Muscle weakness (generalized)    Problem List There are no active problems to display for this patient.   Carey Bullocks, OTR/L 02/12/2017, 11:00 AM  [facility] [address] [place], [place], [address] Phone: [phone]   Fax:  [phone]  Name: Megan Austin MRN: [id number] Date of Birth: Apr 10, 1971

## 2017-02-12 NOTE — Patient Instructions (Signed)
Composite Extension (Passive Flexor Stretch)    Sitting with elbows on table and palms together, slowly lower wrists toward table until stretch is felt. Be sure to keep palms together throughout stretch. Hold __20__ seconds. Relax. Repeat __5__ times. Do _4__ sessions per day.   Wrist Passive Flexion    Rest right forearm on table, (ELBOW STRAIGHT) hand palm-down over edge. Bend wrist by pressing hand down with other hand. Hold __20__ seconds. Repeat __5__ times. Do _4___ sessions per day.  Pronation / Supination (Resistive)    Hold hammer weighing __8__ ounces and rotate palm up and down. Keep elbow flexed at side and wrist straight. Repeat __10__ times each way. Hold 5 sec. Each way. Do __2__ sessions per day.  Wrist Extension: Resisted    With right palm down, _1-2___ pound weight in hand, bend wrist up. Return slowly. Repeat __10__ times per set. Do __1__ sets per session. Do __2__ sessions per day.  Flexion (Resistive)    With hand palm-up and holding _1-2___ lbs, bend hand toward you at wrist. Hold __2__ seconds. Relax slowly. Repeat __10__ times. Do _2___ sessions per day.  1. Grip Strengthening (Resistive Putty)   Squeeze putty using thumb and all fingers. Repeat _20___ times. Do __2__ sessions per day.   2. Roll putty into tube on table and pinch between each finger and thumb x 10 reps each. (can do ring and small finger together)

## 2017-02-26 ENCOUNTER — Ambulatory Visit: Payer: Medicaid Other | Admitting: Occupational Therapy

## 2017-03-05 ENCOUNTER — Ambulatory Visit: Payer: Medicaid Other | Admitting: Occupational Therapy

## 2017-04-11 NOTE — Therapy (Signed)
[facility] [address] [place], [place], [address] Phone: [phone]   Fax:  [phone]  Patient Details  Name: Megan Austin MRN: [id number] Date of Birth: 04-06-1971 Referring Provider:  No ref. provider found  Encounter Date: 04/11/2017  Pt has not returned to outpatient O.T. after 2nd visit on 02/12/17. Therefore will d/c episode of care at this time. Pt had met STG's, but not LTG's.   Carey Bullocks, OTR/L 04/11/2017, 12:52 PM  [facility] [address] [place], [place], [address] Phone: [phone]   Fax:  [phone]

## 2017-11-26 DIAGNOSIS — H04123 Dry eye syndrome of bilateral lacrimal glands: Secondary | ICD-10-CM | POA: Diagnosis not present

## 2018-04-02 IMAGING — CR DG FOREARM 2V*R*
2 series · 2 of 2 positions shown · non-contrast
Comparison: None.

CLINICAL DATA: 38-year-old female with fall and right forearm pain.

EXAM:
RIGHT FOREARM - 2 VIEW

[forearm ap]
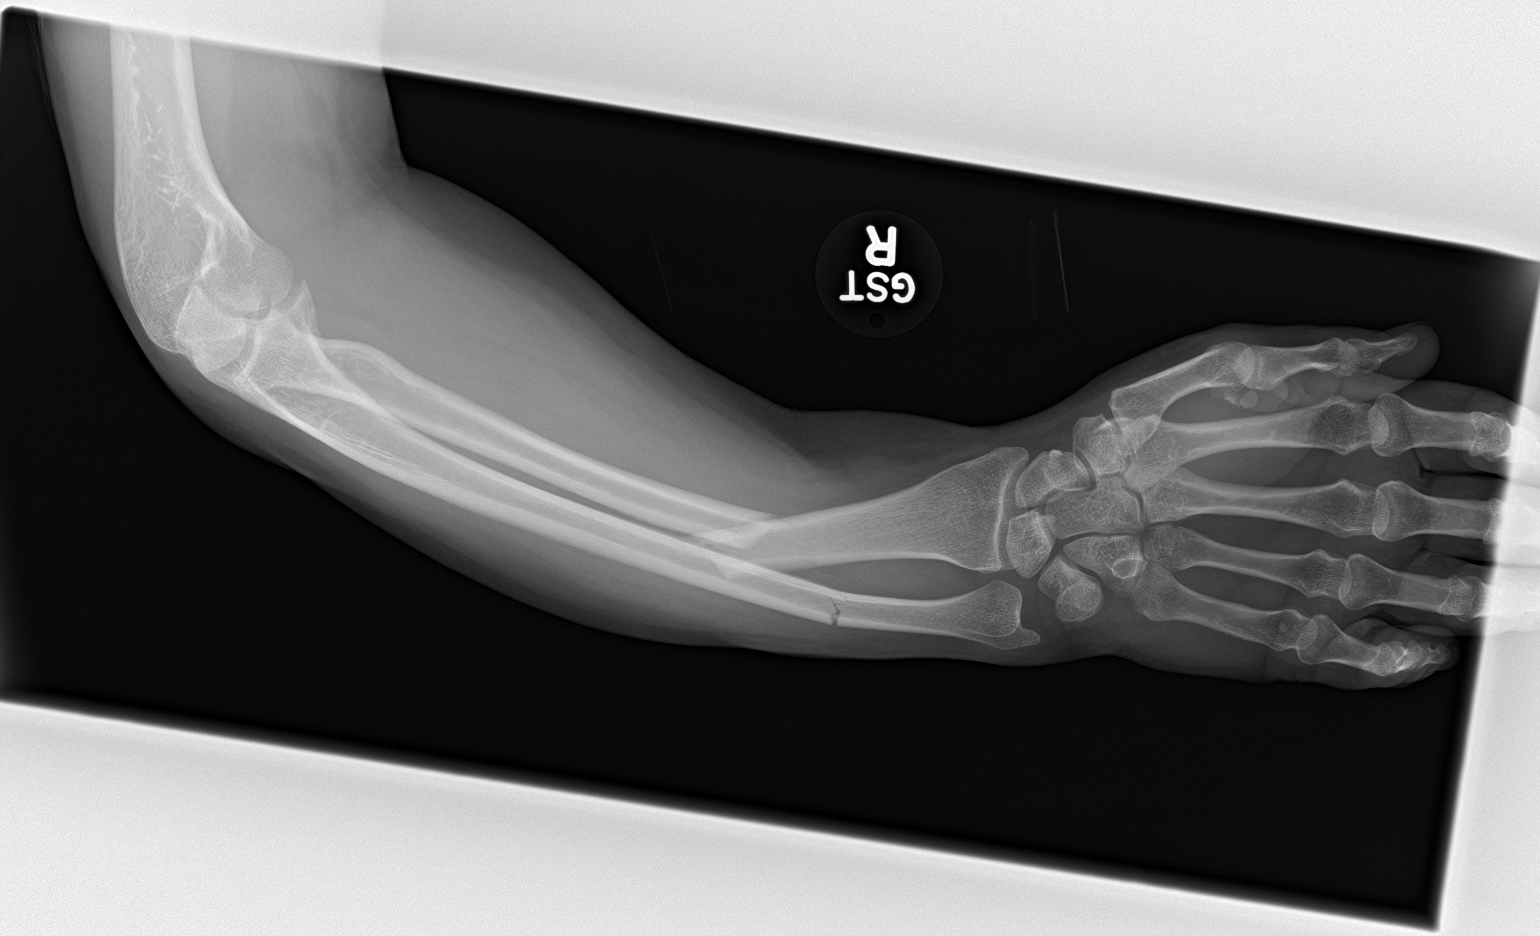

[forearm lat]
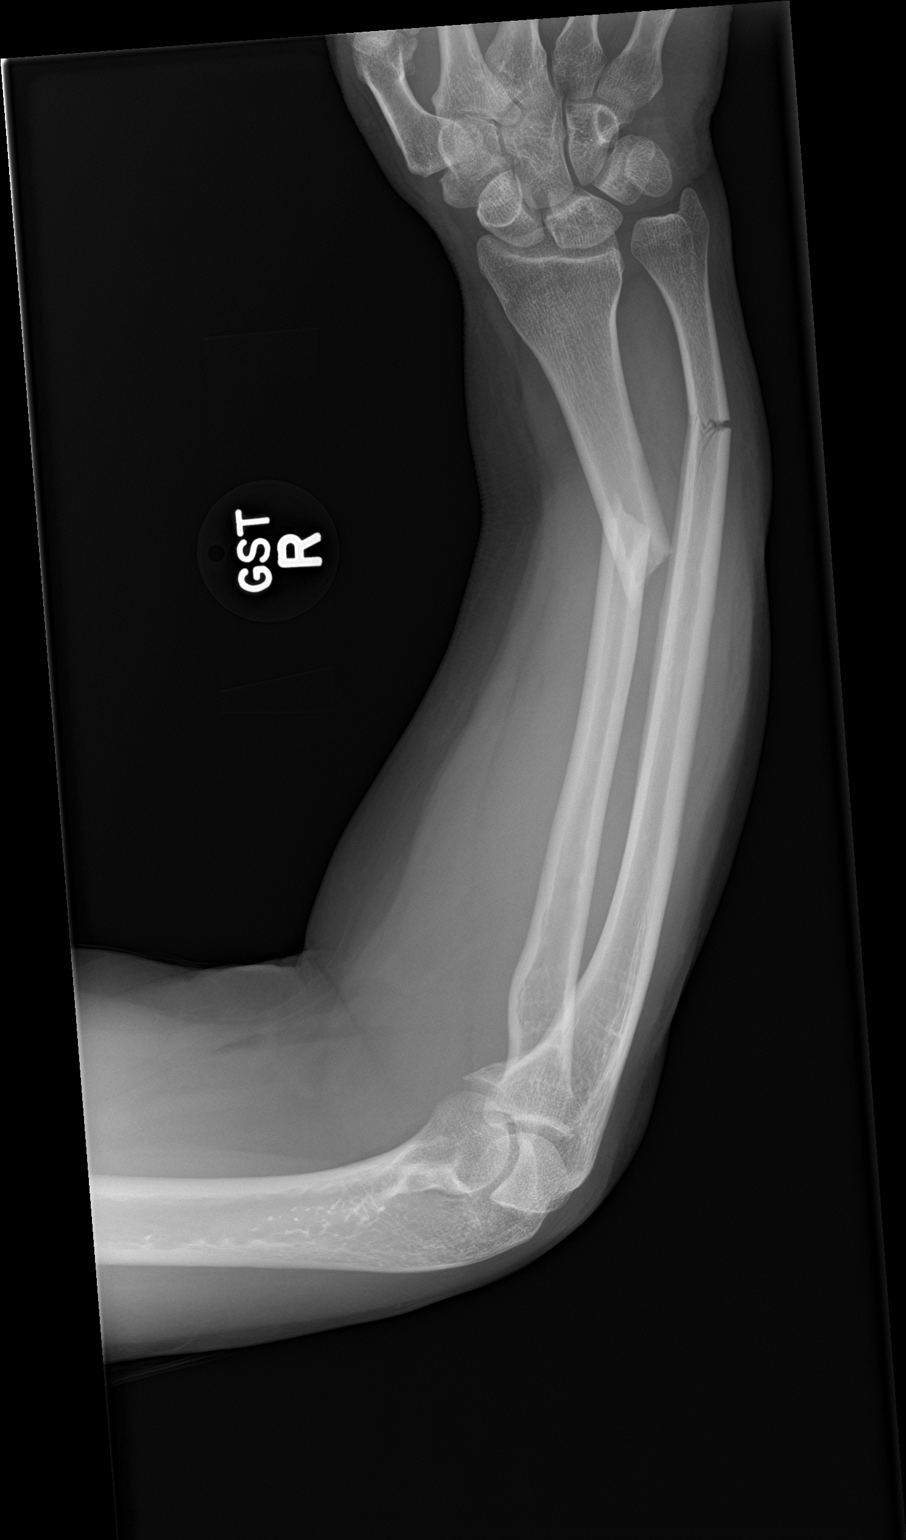

[2 of 2 positions shown; findings below may reference images not displayed]

FINDINGS: There is a displaced and angulated transverse fracture of the distal
third of the radial diaphysis with approximately 1.5 cm overlap.
There is a nondisplaced mildly angulated transverse fracture of the
distal third of the ulnar diaphysis. The bones are well mineralized.
No arthritic changes. There is no dislocation. There is soft tissue
swelling of the distal forearm. No radiopaque foreign object.
IMPRESSION: Fractures of the distal third of the radial and ulnar diaphysis as
described.

## 2019-10-18 DIAGNOSIS — U071 COVID-19: Secondary | ICD-10-CM | POA: Diagnosis not present

## 2019-10-18 DIAGNOSIS — Z03818 Encounter for observation for suspected exposure to other biological agents ruled out: Secondary | ICD-10-CM | POA: Diagnosis not present

## 2019-10-18 DIAGNOSIS — Z20822 Contact with and (suspected) exposure to covid-19: Secondary | ICD-10-CM | POA: Diagnosis not present

## 2019-10-24 ENCOUNTER — Ambulatory Visit (HOSPITAL_COMMUNITY)
Admission: EM | Admit: 2019-10-24 | Discharge: 2019-10-24 | Disposition: A | Discharge status: Home / Self Care | Payer: Medicaid Other

## 2019-10-24 ENCOUNTER — Emergency Department (HOSPITAL_COMMUNITY): Payer: Medicaid Other

## 2019-10-24 ENCOUNTER — Emergency Department (HOSPITAL_COMMUNITY)
Admission: EM | Admit: 2019-10-24 | Discharge: 2019-10-25 | Disposition: A | Discharge status: Home / Self Care | Payer: Medicaid Other | Attending: Emergency Medicine | Admitting: Emergency Medicine

## 2019-10-24 ENCOUNTER — Other Ambulatory Visit: Payer: Self-pay

## 2019-10-24 ENCOUNTER — Encounter (HOSPITAL_COMMUNITY): Payer: Self-pay | Admitting: *Deleted

## 2019-10-24 DIAGNOSIS — I517 Cardiomegaly: Secondary | ICD-10-CM | POA: Diagnosis not present

## 2019-10-24 DIAGNOSIS — Z79899 Other long term (current) drug therapy: Secondary | ICD-10-CM | POA: Insufficient documentation

## 2019-10-24 DIAGNOSIS — J1289 Other viral pneumonia: Secondary | ICD-10-CM | POA: Insufficient documentation

## 2019-10-24 DIAGNOSIS — Z87891 Personal history of nicotine dependence: Secondary | ICD-10-CM | POA: Diagnosis not present

## 2019-10-24 DIAGNOSIS — J1282 Pneumonia due to coronavirus disease 2019: Secondary | ICD-10-CM

## 2019-10-24 DIAGNOSIS — R0602 Shortness of breath: Secondary | ICD-10-CM | POA: Diagnosis not present

## 2019-10-24 DIAGNOSIS — U071 COVID-19: Secondary | ICD-10-CM | POA: Diagnosis not present

## 2019-10-24 DIAGNOSIS — R509 Fever, unspecified: Secondary | ICD-10-CM | POA: Diagnosis present

## 2019-10-24 LAB — COMPREHENSIVE METABOLIC PANEL
ALT: 46 U/L — ABNORMAL HIGH (ref 0–44)
AST: 40 U/L (ref 15–41)
Albumin: 3.7 g/dL (ref 3.5–5.0)
Alkaline Phosphatase: 67 U/L (ref 38–126)
Anion gap: 11 (ref 5–15)
BUN: 10 mg/dL (ref 6–20)
CO2: 25 mmol/L (ref 22–32)
Calcium: 8.9 mg/dL (ref 8.9–10.3)
Chloride: 101 mmol/L (ref 98–111)
Creatinine, Ser: 0.82 mg/dL (ref 0.44–1.00)
GFR calc Af Amer: >60 mL/min (ref >60)
GFR calc non Af Amer: >60 mL/min (ref >60)
Glucose, Bld: 140 mg/dL — ABNORMAL HIGH (ref 70–99)
Potassium: 3.9 mmol/L (ref 3.5–5.1)
Sodium: 137 mmol/L (ref 135–145)
Total Bilirubin: 0.5 mg/dL (ref 0.3–1.2)
Total Protein: 7.5 g/dL (ref 6.5–8.1)

## 2019-10-24 LAB — CBC WITH DIFFERENTIAL/PLATELET
Abs Immature Granulocytes: 0.03 10*3/uL (ref 0.00–0.07)
Basophils Absolute: 0 10*3/uL (ref 0.0–0.1)
Basophils Relative: 0 %
Eosinophils Absolute: 0 10*3/uL (ref 0.0–0.5)
Eosinophils Relative: 0 %
HCT: 48.8 % — ABNORMAL HIGH (ref 36.0–46.0)
Hemoglobin: 15.9 g/dL — ABNORMAL HIGH (ref 12.0–15.0)
Immature Granulocytes: 0 %
Lymphocytes Relative: 18 %
Lymphs Abs: 1.3 10*3/uL (ref 0.7–4.0)
MCH: 30.1 pg (ref 26.0–34.0)
MCHC: 32.6 g/dL (ref 30.0–36.0)
MCV: 92.2 fL (ref 80.0–100.0)
Monocytes Absolute: 0.5 10*3/uL (ref 0.1–1.0)
Monocytes Relative: 7 %
Neutro Abs: 5 10*3/uL (ref 1.7–7.7)
Neutrophils Relative %: 75 %
Platelets: 245 10*3/uL (ref 150–400)
RBC: 5.29 MIL/uL — ABNORMAL HIGH (ref 3.87–5.11)
RDW: 13 % (ref 11.5–15.5)
WBC: 6.8 10*3/uL (ref 4.0–10.5)
nRBC: 0 % (ref 0.0–0.2)

## 2019-10-24 LAB — D-DIMER, QUANTITATIVE: D-Dimer, Quant: 1.15 ug/mL-FEU — ABNORMAL HIGH (ref 0.00–0.50)

## 2019-10-24 LAB — LACTIC ACID, PLASMA: Lactic Acid, Venous: 1.3 mmol/L (ref 0.5–1.9)

## 2019-10-24 MED ORDER — ACETAMINOPHEN 325 MG PO TABS
650.0000 mg | ORAL_TABLET | Freq: Once | ORAL | Status: AC
  Administered 2019-10-24: 650 mg via ORAL
  Filled 2019-10-24: qty 2

## 2019-10-24 MED ORDER — IOHEXOL 350 MG/ML SOLN
100.0000 mL | Freq: Once | INTRAVENOUS | Status: AC | PRN
  Administered 2019-10-24: 100 mL via INTRAVENOUS

## 2019-10-24 MED ORDER — SODIUM CHLORIDE 0.9 % IV BOLUS
500.0000 mL | Freq: Once | INTRAVENOUS | Status: AC
Start: 2019-10-24 — End: 2019-10-24
  Administered 2019-10-24: 500 mL via INTRAVENOUS

## 2019-10-24 NOTE — ED Triage Notes (Signed)
The pt started with diarrhea June 9th  Not feeling well for days afterward low grade temp  tgested at [facility] on June 13th no vaccine no cough  sats drop wheb she lies down to 93 %  The incrfease to normal when she sits or stands  lmp  April 26th

## 2019-10-24 NOTE — ED Provider Notes (Signed)
[facility] EMERGENCY DEPARTMENT Provider Note   CSN: [id number] Arrival date & time: 10/24/19  1744     History Chief Complaint  Patient presents with  . covid positive    Megan Austin is a 49 y.o. female.  The history is provided by the patient and medical records. No language interpreter was used.   Megan Austin is a 49 y.o. female who presents to the Emergency Department complaining of fevers, hypoxia. She was diagnosed with COVID-19 on June 13. She began feeling poorly a few days prior with diarrhea nasal congestion. Over the last week she has been experiencing recurrent fevers that improved with ibuprofen. She does have some chest tightness when she takes a very deep breath. She denies any shortness of breath. She does report decreased oral intake. No abdominal pain, vomiting. Her diarrhea has overall resolved. She presents the emergency department today because she is having ongoing fevers despite taking ibuprofen 1 to 2 times daily. She also states that when she lays down her oxygen level has dropped to 93%. She has no medical problems and takes no prescription medications. Symptoms are moderate and constant.    Past Medical History:  Diagnosis Date  . Allergy   . Arthritis   . Diabetes mellitus without complication ([place])    gestional  . Hx of Bell's palsy 2013   left eye- remains blurred    There are no problems to display for this patient.   Past Surgical History:  Procedure Laterality Date  . DENTAL SURGERY    . MOUTH SURGERY    . ORIF RADIAL FRACTURE Right 12/05/2016   Procedure: RIGHT OPEN REDUCTION AND INTERNAL FIXATION AND REPAIR AS INDICATED, RIGHT RADIUS AND ULNA;  Surgeon: Iran Planas, MD;  Location: [facility];  Service: Orthopedics;  Laterality: Right;     OB History   No obstetric history on file.     No family history on file.  Social History   Tobacco Use  . Smoking status: Former Smoker    Years: 25.00    Quit date: 02/05/2012     Years since quitting: 7.7  . Smokeless tobacco: Never Used  Vaping Use  . Vaping Use: Never used  Substance Use Topics  . Alcohol use: No  . Drug use: No    Home Medications Prior to Admission medications   Medication Sig Start Date End Date Taking? Authorizing Provider  HYDROcodone-acetaminophen (NORCO/VICODIN) 5-325 MG tablet Take 1-2 tablets by mouth every 4 (four) hours as needed. Patient not taking: Reported on 01/10/2017 12/04/16   Ocie Cornfield T, PA-C  HYDROcodone-homatropine [facility]) 5-1.5 MG/5ML syrup Take 2.5-5 mLs by mouth at bedtime as needed. Patient not taking: Reported on 12/04/2016 05/25/16   Tereasa Coop, PA-C  magic mouthwash SOLN Take 5 mLs by mouth 3 (three) times daily as needed for mouth pain. Patient not taking: Reported on 12/04/2016 09/23/16   Emeline General, PA-C  valACYclovir (VALTREX) 1000 MG tablet Take 1 tablet (1,000 mg total) by mouth 3 (three) times daily. Patient not taking: Reported on 05/25/2016 11/19/15   Gregor Hams, MD    Allergies    Amoxicillin, Ceclor [cefaclor], Keflex [cephalexin], Lorabid [loracarbef], and Septra [sulfamethoxazole-trimethoprim]  Review of Systems   Review of Systems  All other systems reviewed and are negative.   Physical Exam Updated Vital Signs BP 132/75   Pulse 95   Temp 100 F (37.8 C) (Oral)   Resp 13   Ht 5\' 4"  (1.626  m)   Wt 81.6 kg   LMP 08/31/2019   SpO2 99%   BMI 30.88 kg/m   Physical Exam Vitals and nursing note reviewed.  Constitutional:      Appearance: She is well-developed.  HENT:     Head: Normocephalic and atraumatic.  Cardiovascular:     Rate and Rhythm: Regular rhythm. Tachycardia present.     Heart sounds: No murmur heard.   Pulmonary:     Effort: Pulmonary effort is normal. No respiratory distress.     Breath sounds: Normal breath sounds.  Abdominal:     Palpations: Abdomen is soft.     Tenderness: There is no abdominal tenderness. There is no guarding or rebound.    Musculoskeletal:        General: No swelling or tenderness.  Skin:    General: Skin is warm and dry.  Neurological:     Mental Status: She is alert and oriented to person, place, and time.  Psychiatric:        Behavior: Behavior normal.     ED Results / Procedures / Treatments   Labs (all labs ordered are listed, but only abnormal results are displayed) Labs Reviewed  CBC WITH DIFFERENTIAL/PLATELET - Abnormal; Notable for the following components:      Result Value   RBC 5.29 (*)    Hemoglobin 15.9 (*)    HCT 48.8 (*)    All other components within normal limits  COMPREHENSIVE METABOLIC PANEL - Abnormal; Notable for the following components:   Glucose, Bld 140 (*)    ALT 46 (*)    All other components within normal limits  D-DIMER, QUANTITATIVE (NOT AT [facility]) - Abnormal; Notable for the following components:   D-Dimer, Quant 1.15 (*)    All other components within normal limits  LACTIC ACID, PLASMA  LACTIC ACID, PLASMA    EKG EKG Interpretation  Date/Time:  Saturday October 24 2019 17:56:34 EDT Ventricular Rate:  121 PR Interval:  132 QRS Duration: 80 QT Interval:  326 QTC Calculation: 462 R Axis:   33 Text Interpretation: Sinus tachycardia Otherwise normal ECG Confirmed by Tilden Fossa [phone]) on 10/24/2019 8:04:33 PM   Radiology CT Angio Chest PE W/Cm &/Or Wo Cm  Result Date: 10/24/2019 CLINICAL DATA:  49 year old female with shortness of breath. Positive COVID-19. EXAM: CT ANGIOGRAPHY CHEST WITH CONTRAST TECHNIQUE: Multidetector CT imaging of the chest was performed using the standard protocol during bolus administration of intravenous contrast. Multiplanar CT image reconstructions and MIPs were obtained to evaluate the vascular anatomy. CONTRAST:  OMNIPAQUE IOHEXOL 350 MG/ML SOLN COMPARISON:  Chest radiograph dated 10/24/2019. FINDINGS: Cardiovascular: There is no cardiomegaly or pericardial effusion. The thoracic aorta is unremarkable. The origins of the  great vessels of the aortic arch appear patent as visualized. No CT evidence of pulmonary embolism. Mediastinum/Nodes: Top-normal bilateral hilar lymph nodes, likely reactive. The esophagus is grossly unremarkable. No mediastinal fluid collection. There is mild mediastinal lipomatosis. Lungs/Pleura: Bilateral predominantly peripheral and subpleural confluent ground-glass opacities most consistent with pneumonia, possibly atypical or viral in etiology including COVID-19. Clinical correlation is recommended. There is no pleural effusion or pneumothorax. The central airways are patent. Upper Abdomen: Probable fatty liver. Musculoskeletal: No chest wall abnormality. No acute or significant osseous findings. Review of the MIP images confirms the above findings. IMPRESSION: 1. No CT evidence of pulmonary embolism. 2. Bilateral predominantly peripheral and subpleural confluent ground-glass opacities most consistent with pneumonia, possibly atypical or viral in etiology and in keeping with history of  COVID-19. Clinical correlation is recommended. Electronically Signed   By: Elgie Collard M.D.   On: 10/24/2019 23:25   DG Chest Port 1 View  Result Date: 10/24/2019 CLINICAL DATA:  Shortness of breath and COVID EXAM: PORTABLE CHEST 1 VIEW COMPARISON:  None. FINDINGS: There is mild cardiomegaly. Both lungs are clear. The visualized skeletal structures are unremarkable. IMPRESSION: No active disease. Electronically Signed   By: Jonna Clark M.D.   On: 10/24/2019 21:37    Procedures Procedures (including critical care time)  Medications Ordered in ED Medications  sodium chloride 0.9 % bolus 500 mL (0 mLs Intravenous Stopped 10/24/19 2356)  acetaminophen (TYLENOL) tablet 650 mg (650 mg Oral Given 10/24/19 2128)  iohexol (OMNIPAQUE) 350 MG/ML injection 100 mL (100 mLs Intravenous Contrast Given 10/24/19 2314)    ED Course  I have reviewed the triage vital signs and the nursing notes.  Pertinent labs & imaging  results that were available during my care of the patient were reviewed by me and considered in my medical decision making (see chart for details).    MDM Rules/Calculators/A&P                         Patient with recent diagnosis of COVID-19 here for evaluation of low oxygen saturations at home, feeling unwell. She does have tachycardia on ED arrival but no distress. Labs with elevation in hemoglobin consistent with mild dehydration. She she was treated with gentle IV fluid hydration, fever control with antipyretics. She has no hypoxia during her ED stay. CTA was obtained, which is negative for PE but does demonstrate infiltrates consistent with COVID-19 pneumonia. Discussed with patient home care for COVID. Discussed oral fluid hydration. Discussed outpatient follow-up and return precautions.   Patient has no stigmata of DVT, presentation is not consistent with acute DVT.  Final Clinical Impression(s) / ED Diagnoses Final diagnoses:  Pneumonia due to COVID-19 virus    Rx / DC Orders ED Discharge Orders    None       Tilden Fossa, MD 10/24/19 2358

## 2020-04-25 ENCOUNTER — Ambulatory Visit: Payer: Medicaid Other | Admitting: Family Medicine

## 2020-08-01 ENCOUNTER — Ambulatory Visit: Payer: Medicaid Other | Attending: Internal Medicine | Admitting: Internal Medicine

## 2020-08-01 ENCOUNTER — Other Ambulatory Visit: Payer: Self-pay

## 2021-02-19 IMAGING — DX DG CHEST 1V PORT
1 series · 1 of 1 positions shown · non-contrast
Comparison: None.

CLINICAL DATA: Shortness of breath and COVID

EXAM:
PORTABLE CHEST 1 VIEW

[chest]
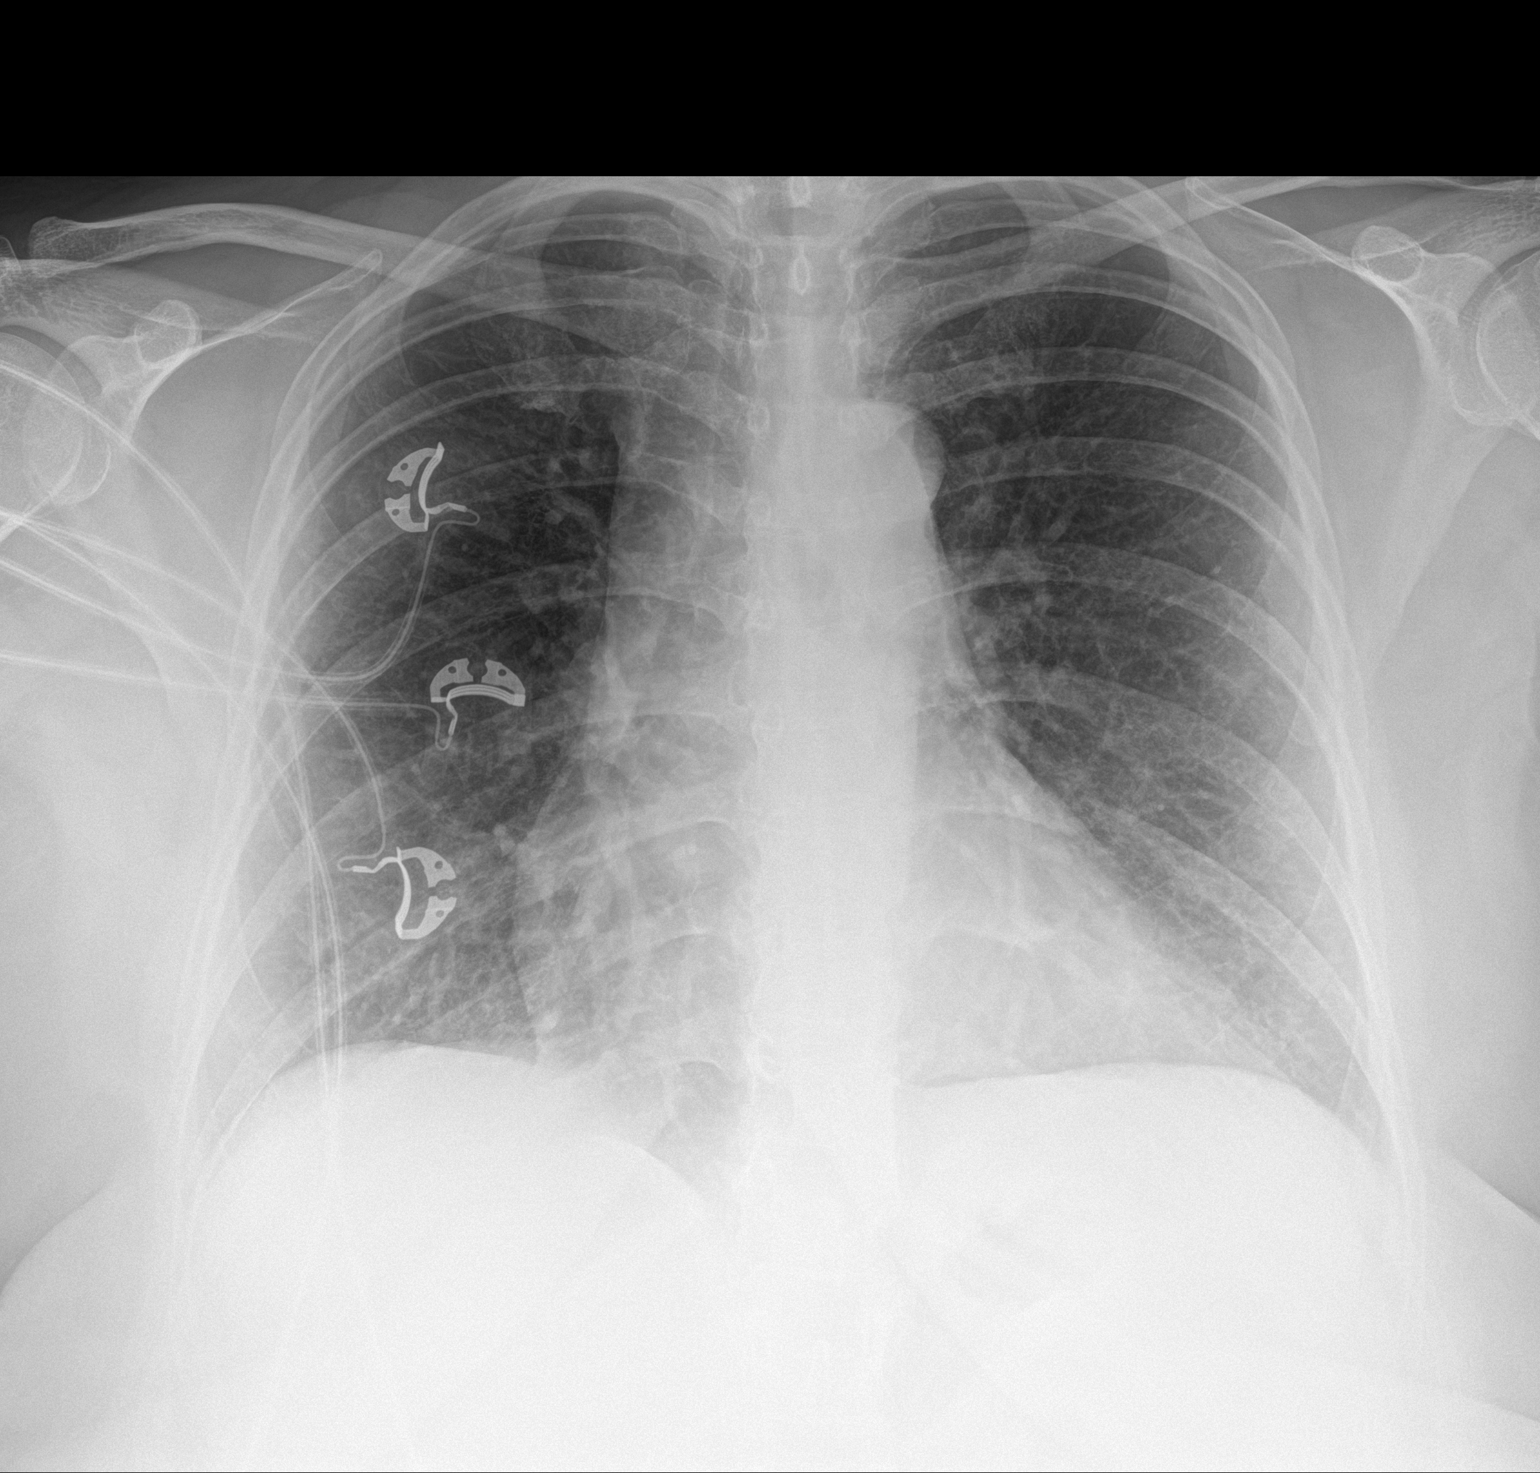

[1 of 1 positions shown; findings below may reference images not displayed]

FINDINGS: There is mild cardiomegaly. Both lungs are clear. The visualized
skeletal structures are unremarkable.
IMPRESSION: No active disease.

## 2021-02-19 IMAGING — CT CT ANGIO CHEST
2 of 6 series · 18 of 46 positions shown · IV contrast (omnipaque)
Comparison: Chest radiograph dated 10/24/2019.

CLINICAL DATA: 49-year-old female with shortness of breath.
Positive [id number].

EXAM:
CT ANGIOGRAPHY CHEST WITH CONTRAST
TECHNIQUE: Multidetector CT imaging of the chest was performed using the
standard protocol during bolus administration of intravenous
contrast. Multiplanar CT image reconstructions and MIPs were
obtained to evaluate the vascular anatomy.
CONTRAST:  100mL OMNIPAQUE IOHEXOL 350 MG/ML SOLN

[Series 7: thins · axial · 0.65mm/px · z∈[+1299,+1500]mm · 15 of 221 slices shown]
[im 10/221  lung]
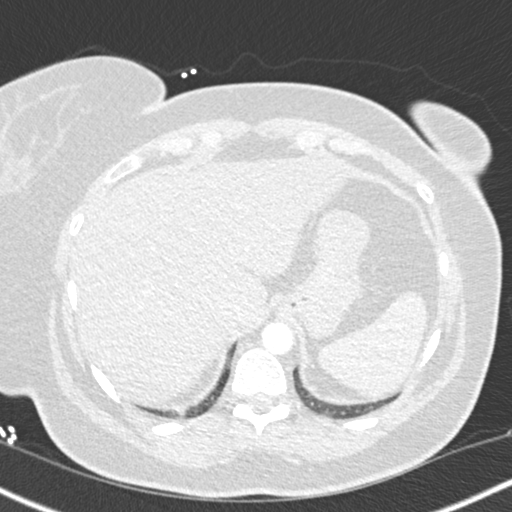
[im 29/221  soft-tissue]
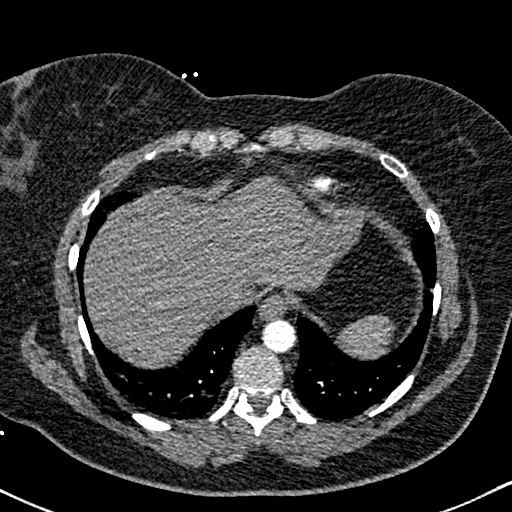
[im 39/221  lung]
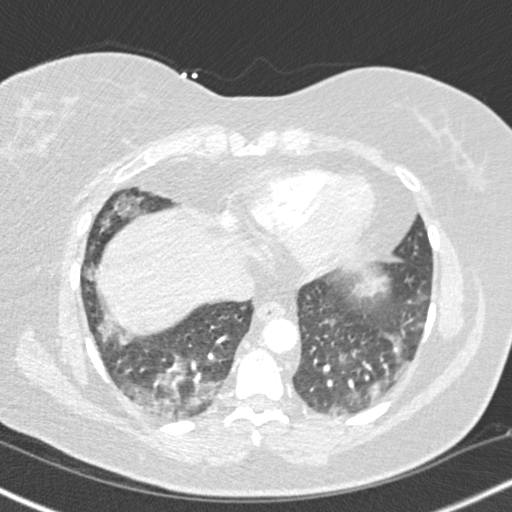
[im 58/221  soft-tissue]
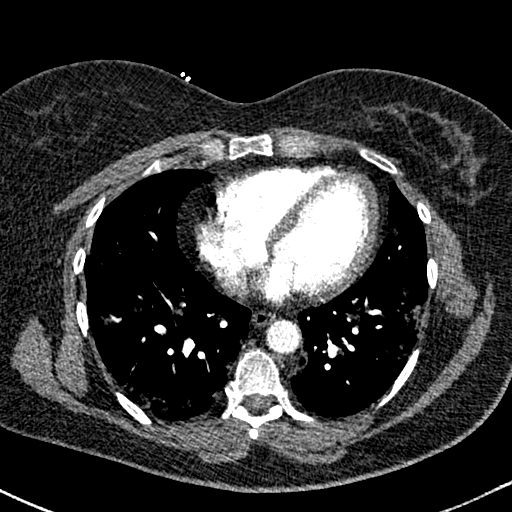
[im 67/221  lung]
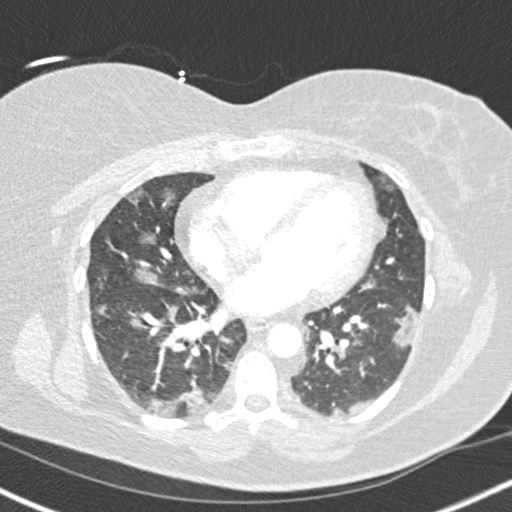
[im 87/221  soft-tissue]
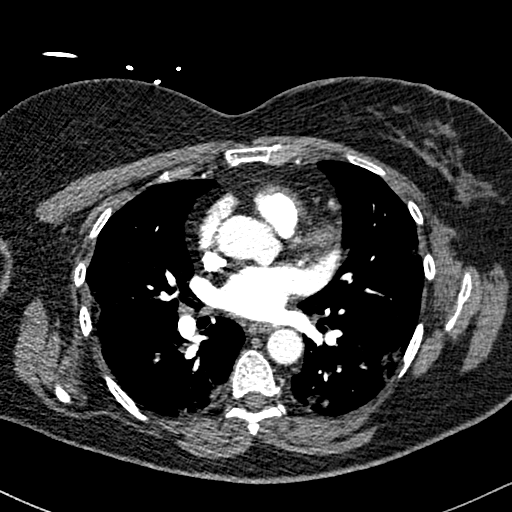
[im 96/221  lung]
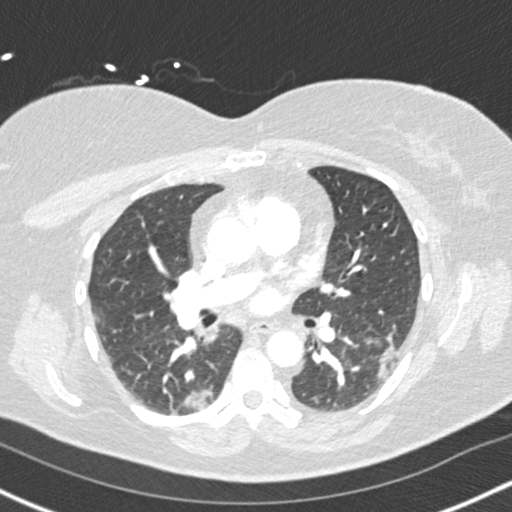
[im 115/221  soft-tissue]
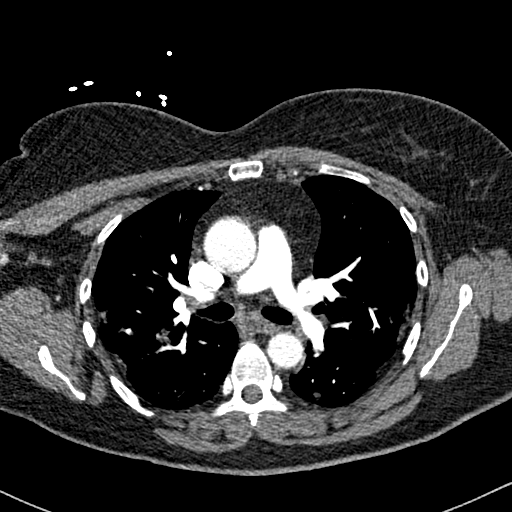
[im 125/221  lung]
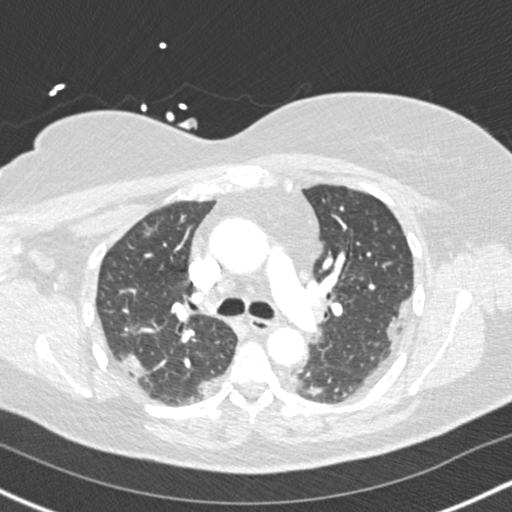
[im 134/221  soft-tissue]
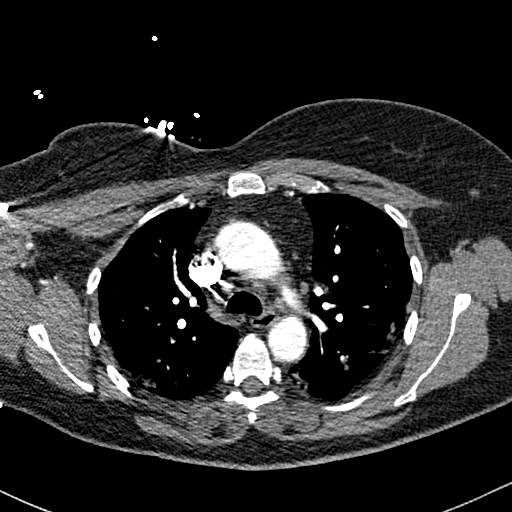
[im 154/221  lung]
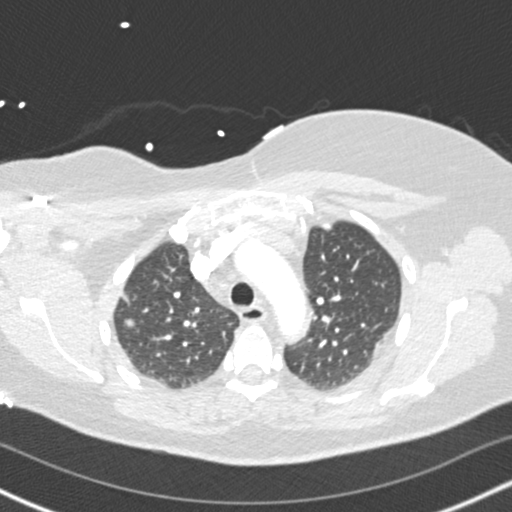
[im 163/221  soft-tissue]
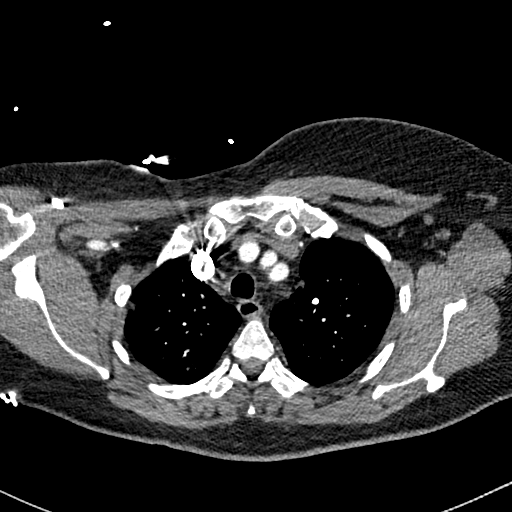
[im 182/221  lung]
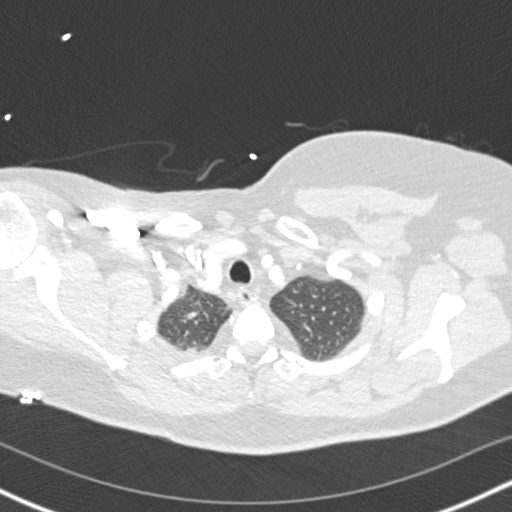
[im 192/221  soft-tissue]
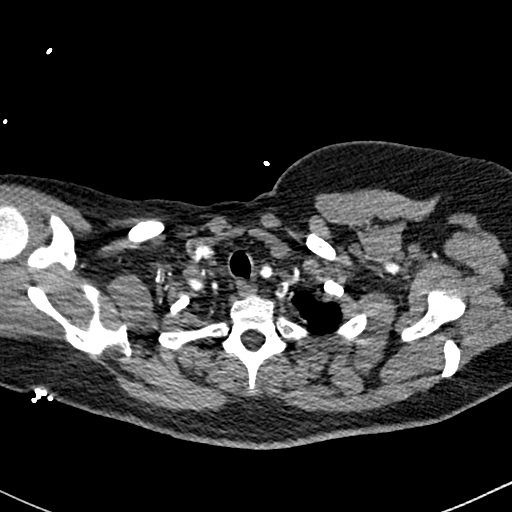
[im 211/221  lung]
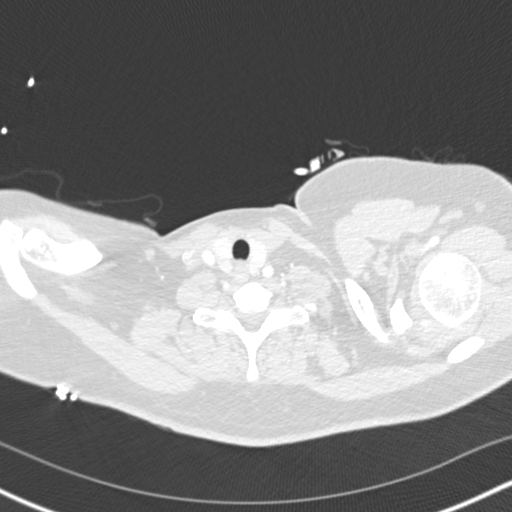

[Series 9: coronal mpr · coronal · 0.45mm/px · 3 of 116 slices shown]
[im 29/116  soft-tissue]
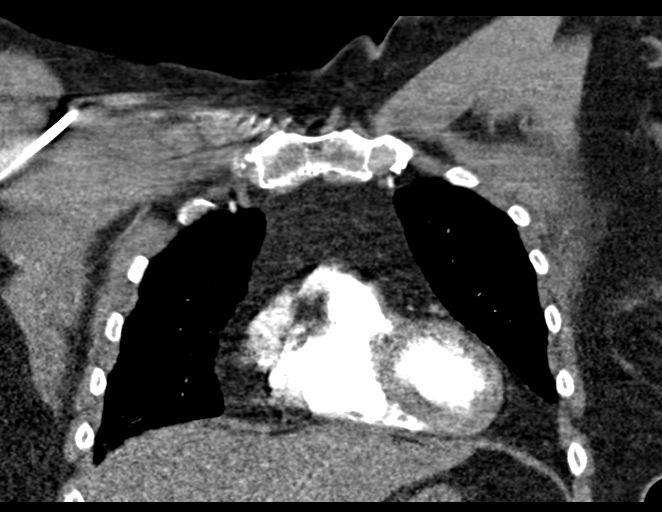
[im 58/116  soft-tissue]
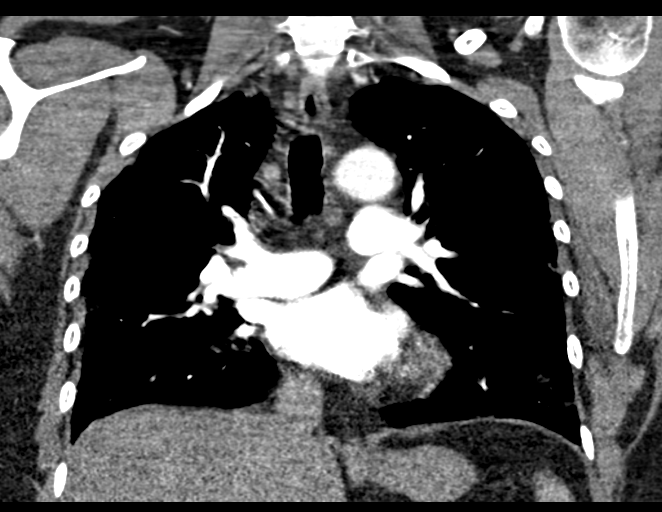
[im 87/116  soft-tissue]
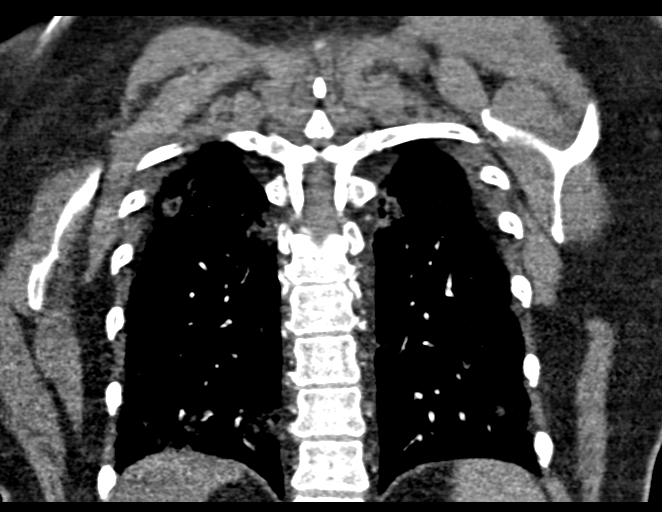

[18 of 46 positions shown; findings below may reference images not displayed]

FINDINGS: Cardiovascular: There is no cardiomegaly or pericardial effusion.
The thoracic aorta is unremarkable. The origins of the great vessels
of the aortic arch appear patent as visualized. No CT evidence of
pulmonary embolism.

Mediastinum/Nodes: Top-normal bilateral hilar lymph nodes, likely
reactive. The esophagus is grossly unremarkable. No mediastinal
fluid collection. There is mild mediastinal lipomatosis.

Lungs/Pleura: Bilateral predominantly peripheral and subpleural
confluent ground-glass opacities most consistent with pneumonia,
possibly atypical or viral in etiology including [id number]. Clinical
correlation is recommended. There is no pleural effusion or
pneumothorax. The central airways are patent.

Upper Abdomen: Probable fatty liver.

Musculoskeletal: No chest wall abnormality. No acute or significant
osseous findings.

Review of the MIP images confirms the above findings.
IMPRESSION: 1. No CT evidence of pulmonary embolism.
2. Bilateral predominantly peripheral and subpleural confluent
ground-glass opacities most consistent with pneumonia, possibly
atypical or viral in etiology and in keeping with history of
[id number]. Clinical correlation is recommended.

## 2022-12-31 ENCOUNTER — Emergency Department (HOSPITAL_COMMUNITY): Payer: Medicaid Other

## 2022-12-31 ENCOUNTER — Ambulatory Visit (HOSPITAL_COMMUNITY)
Admission: EM | Admit: 2022-12-31 | Discharge: 2022-12-31 | Disposition: A | Discharge status: Home / Self Care | Payer: Medicaid Other | Attending: Family Medicine | Admitting: Family Medicine

## 2022-12-31 ENCOUNTER — Other Ambulatory Visit: Payer: Self-pay

## 2022-12-31 ENCOUNTER — Encounter (HOSPITAL_COMMUNITY): Payer: Self-pay

## 2022-12-31 ENCOUNTER — Inpatient Hospital Stay (HOSPITAL_COMMUNITY)
Admission: EM | Admit: 2022-12-31 | Discharge: 2023-01-02 | DRG: 310 | Disposition: A | Discharge status: Home / Self Care | Payer: Medicaid Other | Attending: Cardiology | Admitting: Cardiology

## 2022-12-31 DIAGNOSIS — Z1152 Encounter for screening for COVID-19: Secondary | ICD-10-CM

## 2022-12-31 DIAGNOSIS — R739 Hyperglycemia, unspecified: Secondary | ICD-10-CM

## 2022-12-31 DIAGNOSIS — Z79899 Other long term (current) drug therapy: Secondary | ICD-10-CM

## 2022-12-31 DIAGNOSIS — Z8632 Personal history of gestational diabetes: Secondary | ICD-10-CM

## 2022-12-31 DIAGNOSIS — I471 Supraventricular tachycardia, unspecified: Secondary | ICD-10-CM

## 2022-12-31 DIAGNOSIS — I4719 Other supraventricular tachycardia: Principal | ICD-10-CM | POA: Diagnosis present

## 2022-12-31 DIAGNOSIS — E1165 Type 2 diabetes mellitus with hyperglycemia: Secondary | ICD-10-CM | POA: Diagnosis present

## 2022-12-31 DIAGNOSIS — Z882 Allergy status to sulfonamides status: Secondary | ICD-10-CM

## 2022-12-31 DIAGNOSIS — Z5320 Procedure and treatment not carried out because of patient's decision for unspecified reasons: Secondary | ICD-10-CM | POA: Diagnosis present

## 2022-12-31 LAB — BASIC METABOLIC PANEL
Anion gap: 11 (ref 5–15)
BUN: 13 mg/dL (ref 6–20)
CO2: 26 mmol/L (ref 22–32)
Calcium: 9.6 mg/dL (ref 8.9–10.3)
Chloride: 101 mmol/L (ref 98–111)
Creatinine, Ser: 0.82 mg/dL (ref 0.44–1.00)
GFR, Estimated: >60 mL/min (ref >60)
Glucose, Bld: 215 mg/dL — ABNORMAL HIGH (ref 70–99)
Potassium: 4.4 mmol/L (ref 3.5–5.1)
Sodium: 138 mmol/L (ref 135–145)

## 2022-12-31 LAB — CBC WITH DIFFERENTIAL/PLATELET
Abs Immature Granulocytes: 0.03 10*3/uL (ref 0.00–0.07)
Basophils Absolute: 0.1 10*3/uL (ref 0.0–0.1)
Basophils Relative: 1 %
Eosinophils Absolute: 0.1 10*3/uL (ref 0.0–0.5)
Eosinophils Relative: 1 %
HCT: 49.2 % — ABNORMAL HIGH (ref 36.0–46.0)
Hemoglobin: 16.2 g/dL — ABNORMAL HIGH (ref 12.0–15.0)
Immature Granulocytes: 0 %
Lymphocytes Relative: 32 %
Lymphs Abs: 3 10*3/uL (ref 0.7–4.0)
MCH: 30 pg (ref 26.0–34.0)
MCHC: 32.9 g/dL (ref 30.0–36.0)
MCV: 91.1 fL (ref 80.0–100.0)
Monocytes Absolute: 0.7 10*3/uL (ref 0.1–1.0)
Monocytes Relative: 7 %
Neutro Abs: 5.5 10*3/uL (ref 1.7–7.7)
Neutrophils Relative %: 59 %
Platelets: 350 10*3/uL (ref 150–400)
RBC: 5.4 MIL/uL — ABNORMAL HIGH (ref 3.87–5.11)
RDW: 12.5 % (ref 11.5–15.5)
WBC: 9.4 10*3/uL (ref 4.0–10.5)
nRBC: 0 % (ref 0.0–0.2)

## 2022-12-31 LAB — POCT FASTING CBG KUC MANUAL ENTRY: POCT Glucose (KUC): 230 mg/dL — AB (ref 70–99)

## 2022-12-31 LAB — RESP PANEL BY RT-PCR (RSV, FLU A&B, COVID)  RVPGX2
Influenza A by PCR: NEGATIVE
Influenza B by PCR: NEGATIVE
Resp Syncytial Virus by PCR: NEGATIVE
SARS Coronavirus 2 by RT PCR: NEGATIVE

## 2022-12-31 LAB — HEMOGLOBIN A1C
Hgb A1c MFr Bld: 9.5 % — ABNORMAL HIGH (ref 4.8–5.6)
Mean Plasma Glucose: 225.95 mg/dL

## 2022-12-31 LAB — D-DIMER, QUANTITATIVE: D-Dimer, Quant: 1.06 ug{FEU}/mL — ABNORMAL HIGH (ref 0.00–0.50)

## 2022-12-31 LAB — MAGNESIUM: Magnesium: 2.2 mg/dL (ref 1.7–2.4)

## 2022-12-31 LAB — TSH: TSH: 2.319 u[IU]/mL (ref 0.350–4.500)

## 2022-12-31 LAB — TROPONIN I (HIGH SENSITIVITY): Troponin I (High Sensitivity): 7 ng/L (ref <18)

## 2022-12-31 LAB — T4, FREE: Free T4: 0.93 ng/dL (ref 0.61–1.12)

## 2022-12-31 MED ORDER — METOPROLOL TARTRATE 25 MG PO TABS
25.0000 mg | ORAL_TABLET | Freq: Three times a day (TID) | ORAL | Status: DC
  Administered 2022-12-31 – 2023-01-01 (×3): 25 mg via ORAL
  Filled 2022-12-31 (×3): qty 1

## 2022-12-31 MED ORDER — IOHEXOL 350 MG/ML SOLN
75.0000 mL | Freq: Once | INTRAVENOUS | Status: AC | PRN
  Administered 2022-12-31: 75 mL via INTRAVENOUS

## 2022-12-31 MED ORDER — METOPROLOL TARTRATE 5 MG/5ML IV SOLN: 5.0000 mg | Freq: Once | INTRAVENOUS | Status: DC

## 2022-12-31 MED ORDER — LACTATED RINGERS IV BOLUS
1000.0000 mL | Freq: Once | INTRAVENOUS | Status: AC
  Administered 2022-12-31: 1000 mL via INTRAVENOUS

## 2022-12-31 MED ORDER — ENOXAPARIN SODIUM 40 MG/0.4ML IJ SOSY
40.0000 mg | PREFILLED_SYRINGE | INTRAMUSCULAR | Status: DC
  Filled 2022-12-31 (×2): qty 0.4

## 2022-12-31 MED ORDER — METOPROLOL TARTRATE 5 MG/5ML IV SOLN
5.0000 mg | Freq: Once | INTRAVENOUS | Status: AC
  Administered 2022-12-31: 5 mg via INTRAVENOUS
  Filled 2022-12-31: qty 5

## 2022-12-31 NOTE — ED Notes (Signed)
ED TO INPATIENT HANDOFF REPORT  ED Nurse Name and Phone #: [phone]  S Name/Age/Gender Megan Austin 52 y.o. female Room/Bed: 020C/020C  Code Status   Code Status: Full Code  Home/SNF/Other Home Patient oriented to: self, place, time, and situation Is this baseline? Yes   Triage Complete: Triage complete  Chief Complaint Paroxysmal SVT (supraventricular tachycardia) [I47.10]  Triage Note Pt arrives via [facility] from Urgent Care. Pt has been going in and out of SVT. It resolves with vagal maneuvers. Pt AxOx4. Reports associated dizziness.    Allergies Allergies  Allergen Reactions   Amoxicillin Hives   Ceclor [Cefaclor] Hives   Keflex [Cephalexin] Hives   Lorabid [Loracarbef] Hives   Septra [Sulfamethoxazole-Trimethoprim] Hives    Level of Care/Admitting Diagnosis ED Disposition     ED Disposition  Admit   Condition  --   Comment  Hospital Area: [facility] [[id number]]  Level of Care: Telemetry Cardiac [[id number]]  May place patient in observation at [facility] or [facility] if equivalent level of care is available:: No  Covid Evaluation: Confirmed COVID Negative  Diagnosis: Paroxysmal SVT (supraventricular tachycardia) [098119]  Admitting Physician: Lanier Prude [[id number]]  Attending Physician: Lanier Prude [[id number]]          B Medical/Surgery History Past Medical History:  Diagnosis Date   Allergy    Arthritis    Diabetes mellitus without complication (HCC)    gestional   Hx of Bell's palsy 2013   left eye- remains blurred   Past Surgical History:  Procedure Laterality Date   DENTAL SURGERY     MOUTH SURGERY     ORIF RADIAL FRACTURE Right 12/05/2016   Procedure: RIGHT OPEN REDUCTION AND INTERNAL FIXATION AND REPAIR AS INDICATED, RIGHT RADIUS AND ULNA;  Surgeon: Bradly Bienenstock, MD;  Location: [facility] OR;  Service: Orthopedics;  Laterality: Right;     A IV Location/Drains/Wounds Patient Lines/Drains/Airways Status     Active  Line/Drains/Airways     Name Placement date Placement time Site Days   Peripheral IV 12/31/22 22 G Distal;Left;Posterior Forearm 12/31/22  1111  Forearm  less than 1   Peripheral IV 12/31/22 20 G Left Antecubital 12/31/22  1145  Antecubital  less than 1   Incision (Closed) 12/05/16 Arm Right 12/05/16  1831  -- 2217            Intake/Output Last 24 hours No intake or output data in the 24 hours ending 12/31/22 1637  Labs/Imaging Results for orders placed or performed during the hospital encounter of 12/31/22 (from the past 48 hour(s))  Resp panel by RT-PCR (RSV, Flu A&B, Covid) Anterior Nasal Swab     Status: None   Collection Time: 12/31/22 11:34 AM   Specimen: Anterior Nasal Swab  Result Value Ref Range   SARS Coronavirus 2 by RT PCR NEGATIVE NEGATIVE   Influenza A by PCR NEGATIVE NEGATIVE   Influenza B by PCR NEGATIVE NEGATIVE    Comment: (NOTE) The Xpert Xpress SARS-CoV-2/FLU/RSV plus assay is intended as an aid in the diagnosis of influenza from Nasopharyngeal swab specimens and should not be used as a sole basis for treatment. Nasal washings and aspirates are unacceptable for Xpert Xpress SARS-CoV-2/FLU/RSV testing.  Fact Sheet for Patients: BloggerCourse.com  Fact Sheet for Healthcare Providers: SeriousBroker.it  This test is not yet approved or cleared by the [place] FDA and has been authorized for detection and/or diagnosis of SARS-CoV-2 by FDA under an Emergency Use Authorization (EUA). This EUA will  remain in effect (meaning this test can be used) for the duration of the COVID-19 declaration under Section 564(b)(1) of the Act, 21 U.S.C. section 360bbb-3(b)(1), unless the authorization is terminated or revoked.     Resp Syncytial Virus by PCR NEGATIVE NEGATIVE    Comment: (NOTE) Fact Sheet for Patients: BloggerCourse.com  Fact Sheet for Healthcare  Providers: SeriousBroker.it  This test is not yet approved or cleared by the [place] FDA and has been authorized for detection and/or diagnosis of SARS-CoV-2 by FDA under an Emergency Use Authorization (EUA). This EUA will remain in effect (meaning this test can be used) for the duration of the COVID-19 declaration under Section 564(b)(1) of the Act, 21 U.S.C. section 360bbb-3(b)(1), unless the authorization is terminated or revoked.  Performed at [facility], [address], [place], [place] [address]   CBC with Differential     Status: Abnormal   Collection Time: 12/31/22 11:45 AM  Result Value Ref Range   WBC 9.4 4.0 - 10.5 K/uL   RBC 5.40 (H) 3.87 - 5.11 MIL/uL   Hemoglobin 16.2 (H) 12.0 - 15.0 g/dL   HCT 19.1 (H) 47.8 - 29.5 %   MCV 91.1 80.0 - 100.0 fL   MCH 30.0 26.0 - 34.0 pg   MCHC 32.9 30.0 - 36.0 g/dL   RDW 62.1 30.8 - 65.7 %   Platelets 350 150 - 400 K/uL   nRBC 0.0 0.0 - 0.2 %   Neutrophils Relative % 59 %   Neutro Abs 5.5 1.7 - 7.7 K/uL   Lymphocytes Relative 32 %   Lymphs Abs 3.0 0.7 - 4.0 K/uL   Monocytes Relative 7 %   Monocytes Absolute 0.7 0.1 - 1.0 K/uL   Eosinophils Relative 1 %   Eosinophils Absolute 0.1 0.0 - 0.5 K/uL   Basophils Relative 1 %   Basophils Absolute 0.1 0.0 - 0.1 K/uL   Immature Granulocytes 0 %   Abs Immature Granulocytes 0.03 0.00 - 0.07 K/uL    Comment: Performed at [facility], [address] [place], [place] [address]  Basic metabolic panel     Status: Abnormal   Collection Time: 12/31/22 11:45 AM  Result Value Ref Range   Sodium 138 135 - 145 mmol/L   Potassium 4.4 3.5 - 5.1 mmol/L   Chloride 101 98 - 111 mmol/L   CO2 26 22 - 32 mmol/L   Glucose, Bld 215 (H) 70 - 99 mg/dL    Comment: Glucose reference range applies only to samples taken after fasting for at least 8 hours.   BUN 13 6 - 20 mg/dL   Creatinine, Ser 2.95 0.44 - 1.00 mg/dL   Calcium 9.6 8.9 - 28.4 mg/dL   GFR,  Estimated >13 >24 mL/min    Comment: (NOTE) Calculated using the CKD-EPI Creatinine Equation (2021)    Anion gap 11 5 - 15    Comment: Performed at [facility], [address], [place], [place] [address]  Magnesium     Status: None   Collection Time: 12/31/22 11:45 AM  Result Value Ref Range   Magnesium 2.2 1.7 - 2.4 mg/dL    Comment: Performed at [facility], [address], [place], [place] [address]  D-dimer, quantitative     Status: Abnormal   Collection Time: 12/31/22 11:45 AM  Result Value Ref Range   D-Dimer, Quant 1.06 (H) 0.00 - 0.50 ug/mL-FEU    Comment: (NOTE) At the manufacturer cut-off value of  0.5 g/mL FEU, this assay has a negative predictive value of 95-100%.This assay is intended for use in conjunction with a clinical pretest probability (PTP) assessment model to exclude pulmonary embolism (PE) and deep venous thrombosis (DVT) in outpatients suspected of PE or DVT. Results should be correlated with clinical presentation. Performed at [facility], [address], [place], [place] [address]   Troponin I (High Sensitivity)     Status: None   Collection Time: 12/31/22  1:24 PM  Result Value Ref Range   Troponin I (High Sensitivity) 7 <18 ng/L    Comment: (NOTE) Elevated high sensitivity troponin I (hsTnI) values and significant  changes across serial measurements may suggest ACS but many other  chronic and acute conditions are known to elevate hsTnI results.  Refer to the "Links" section for chest pain algorithms and additional  guidance. Performed at [facility], [address], [place], [place] [address]   TSH     Status: None   Collection Time: 12/31/22  1:24 PM  Result Value Ref Range   TSH 2.319 0.350 - 4.500 uIU/mL    Comment: Performed by a 3rd Generation assay with a functional sensitivity of <=0.01 uIU/mL. Performed at [facility], [address] [place], [place] [address]   T4, free     Status: None   Collection  Time: 12/31/22  1:24 PM  Result Value Ref Range   Free T4 0.93 0.61 - 1.12 ng/dL    Comment: (NOTE) Biotin ingestion may interfere with free T4 tests. If the results are inconsistent with the TSH level, previous test results, or the clinical presentation, then consider biotin interference. If needed, order repeat testing after stopping biotin. Performed at [facility], [address] [place], [place] [address]   Hemoglobin A1c     Status: Abnormal   Collection Time: 12/31/22  3:39 PM  Result Value Ref Range   Hgb A1c MFr Bld 9.5 (H) 4.8 - 5.6 %    Comment: (NOTE) Pre diabetes:          5.7%-6.4%  Diabetes:              >6.4%  Glycemic control for   <7.0% adults with diabetes    Mean Plasma Glucose 225.95 mg/dL    Comment: Performed at [facility], [address], [place], [place] [address]   CT Angio Chest PE W and/or Wo Contrast  Result Date: 12/31/2022 CLINICAL DATA:  Supraventricular tachycardia. Clinical concern for acute pulmonary embolism. Positive D-dimer levels. EXAM: CT ANGIOGRAPHY CHEST WITH CONTRAST TECHNIQUE: Multidetector CT imaging of the chest was performed using the standard protocol during bolus administration of intravenous contrast. Multiplanar CT image reconstructions and MIPs were obtained to evaluate the vascular anatomy. RADIATION DOSE REDUCTION: This exam was performed according to the departmental dose-optimization program which includes automated exposure control, adjustment of the mA and/or kV according to patient size and/or use of iterative reconstruction technique. CONTRAST:  75mL OMNIPAQUE IOHEXOL 350 MG/ML SOLN COMPARISON:  Concurrent chest radiographs.  Chest CTA 10/24/2019. FINDINGS: Cardiovascular: The pulmonary arteries are well opacified with contrast to the level of the subsegmental branches. There is no evidence of acute pulmonary embolism. There is limited opacification of the thoracic aorta. No acute systemic arterial abnormalities  are identified. The heart size is normal. There is no pericardial effusion. Mediastinum/Nodes: There are no enlarged mediastinal, hilar or axillary lymph nodes. The thyroid gland, trachea and esophagus demonstrate no significant findings.  Lungs/Pleura: No pleural effusion or pneumothorax. Interval clearing of the patchy ground-glass opacities previously demonstrated in both lungs. Minimal dependent atelectasis bilaterally. No confluent airspace disease or suspicious pulmonary nodule. Upper abdomen:  Subjective hepatic steatosis.  No acute abnormality. Musculoskeletal/Chest wall: There is no chest wall mass or suspicious osseous finding. Unless specific follow-up recommendations are mentioned in the findings or impression sections, no imaging follow-up of any mentioned incidental findings is recommended. Review of the MIP images confirms the above findings. IMPRESSION: 1. No evidence of acute pulmonary embolism or other acute chest findings. 2. Interval clearing of previously demonstrated patchy ground-glass opacities in both lungs. 3. Subjective hepatic steatosis. Electronically Signed   By: Carey Bullocks M.D.   On: 12/31/2022 15:55   DG Chest Portable 1 View  Result Date: 12/31/2022 CLINICAL DATA:  Supraventricular tachycardia. EXAM: PORTABLE CHEST 1 VIEW COMPARISON:  October 24, 2019. FINDINGS: Stable cardiomediastinal silhouette. Both lungs are clear. The visualized skeletal structures are unremarkable. IMPRESSION: No active disease. Electronically Signed   By: Lupita Raider M.D.   On: 12/31/2022 13:48    Pending Labs Unresulted Labs (From admission, onward)     Start     Ordered   01/07/23 0500  Creatinine, serum  (enoxaparin (LOVENOX)    CrCl >/= 30 ml/min)  Weekly,   R     Comments: while on enoxaparin therapy    12/31/22 1603   01/01/23 0500  Basic metabolic panel  Tomorrow morning,   R        12/31/22 1603   01/01/23 0500  CBC  Tomorrow morning,   R        12/31/22 1603   12/31/22 1603   HIV Antibody (routine testing w rflx)  (HIV Antibody (Routine testing w reflex) panel)  Once,   R        12/31/22 1603            Vitals/Pain Today's Vitals   12/31/22 1200 12/31/22 1230 12/31/22 1245 12/31/22 1545  BP: (!) 145/85  (!) 114/91 130/88  Pulse: (!) 188 (!) 164 (!) 185 (!) 155  Resp: (!) 31 17 15 16   Temp:    97.7 F (36.5 C)  TempSrc:    Oral  SpO2: 99% 100% 100% 99%  Weight:    77.1 kg  Height:    5\' 4"  (1.626 m)  PainSc:    0-No pain    Isolation Precautions No active isolations  Medications Medications  metoprolol tartrate (LOPRESSOR) tablet 25 mg (has no administration in time range)  enoxaparin (LOVENOX) injection 40 mg (has no administration in time range)  lactated ringers bolus 1,000 mL (1,000 mLs Intravenous New Bag/Given 12/31/22 1150)  metoprolol tartrate (LOPRESSOR) injection 5 mg (5 mg Intravenous Given 12/31/22 1533)  iohexol (OMNIPAQUE) 350 MG/ML injection 75 mL (75 mLs Intravenous Contrast Given 12/31/22 1520)    Mobility walks     Focused Assessments Cardiac Assessment Handoff:  Cardiac Rhythm: Sinus tachycardia No results found for: "CKTOTAL", "CKMB", "CKMBINDEX", "TROPONINI" Lab Results  Component Value Date   DDIMER 1.06 (H) 12/31/2022   Does the Patient currently have chest pain? No    R Recommendations: See Admitting Provider Note  Report given to:   Additional Notes: Patient goes into SVT and SR on and off,mostly when she get up and move her head. HR can goes in the 160's to 180's and some times in the 200's. Cardiology consulted. She was give IV lopressor 5mg  and she is now getting the  po one. Please read cardiology note. Sometime she said she feels a pressure or tightness when HR is in SVT.

## 2022-12-31 NOTE — ED Triage Notes (Signed)
Pt arrives via [facility] from Urgent Care. Pt has been going in and out of SVT. It resolves with vagal maneuvers. Pt AxOx4. Reports associated dizziness.

## 2022-12-31 NOTE — ED Provider Notes (Signed)
[facility]   [phone] 12/31/22 Arrival Time: 1001  ASSESSMENT & PLAN:  1. SVT (supraventricular tachycardia)   2. Elevated blood sugar    [facility] to transport to ED. Stable upon discharge. In and out of SVT here; has converted to NSR every time with vagal maneuvers. No adenosine given.  ECG: Interpreted by me:  #1) normal EKG, normal sinus rhythm. No STEMI. #2) SVT at 192 BPM  For some reason, ECG is not crossing over into EPIC. Hard copy given to EMS. See below.   Also elevated blood sugar. Some question of her being told she was pre-diabetic in the past.   Reviewed expectations re: course of current medical issues. Questions answered. Outlined signs and symptoms indicating need for more acute intervention. Patient verbalized understanding. After Visit Summary given.   SUBJECTIVE:  History from: patient. Megan Austin is a 52 y.o. female who presents with complaint of fluttering in chest; this am; no h/o similar. Makes her feel like she's going to pass out. Denies CP/SOB. Does admit to increased caffeine use lately. Denies recreational drug use.  Social History   Substance and Sexual Activity  Alcohol Use No   Social History   Tobacco Use  Smoking Status Former   Current packs/day: 0.00   Types: Cigarettes   Start date: 02/05/1987   Quit date: 02/05/2012   Years since quitting: 10.9  Smokeless Tobacco Never   OBJECTIVE:  Vitals:   12/31/22 1008  BP: (!) 150/101  Pulse: 97  Resp: 16  Temp: (!) 97.5 F (36.4 C)  TempSrc: Oral  SpO2: 97%    General appearance: alert, oriented, no acute distress Eyes: PERRLA; EOMI; conjunctivae normal HENT: normocephalic; atraumatic Neck: supple with FROM Lungs: without labored respirations; speaks full sentences without difficulty; CTAB Heart: in and out of tachycardia Chest Wall: without tenderness to palpation Abdomen: soft, non-tender; no guarding or rebound tenderness Extremities: without edema;  without calf swelling or tenderness; symmetrical without gross deformities Skin: warm and dry; without rash or lesions Neuro: normal gait Psychological: alert and cooperative; normal mood and affect  Labs: Results for orders placed or performed during the hospital encounter of 12/31/22  POC CBG monitoring  Result Value Ref Range   POCT Glucose ([facility]) 230 (A) 70 - 99 mg/dL   Labs Reviewed  POCT FASTING CBG [facility] MANUAL ENTRY - Abnormal; Notable for the following components:      Result Value   POCT Glucose ([facility]) 230 (*)    All other components within normal limits    Allergies  Allergen Reactions   Amoxicillin Hives   Ceclor [Cefaclor] Hives   Keflex [Cephalexin] Hives   Lorabid [Loracarbef] Hives   Septra [Sulfamethoxazole-Trimethoprim] Hives    Past Medical History:  Diagnosis Date   Allergy    Arthritis    Diabetes mellitus without complication (HCC)    gestional   Hx of Bell's palsy 2013   left eye- remains blurred   Social History   Socioeconomic History   Marital status: Married    Spouse name: Not on file   Number of children: Not on file   Years of education: Not on file   Highest education level: Not on file  Occupational History   Not on file  Tobacco Use   Smoking status: Former    Current packs/day: 0.00    Types: Cigarettes    Start date: 02/05/1987    Quit date: 02/05/2012    Years since quitting: 10.9   Smokeless tobacco:  Never  Vaping Use   Vaping status: Never Used  Substance and Sexual Activity   Alcohol use: No   Drug use: No   Sexual activity: Not on file  Other Topics Concern   Not on file  Social History Narrative   Not on file   Social Determinants of Health   Financial Resource Strain: Not on file  Food Insecurity: Not on file  Transportation Needs: Not on file  Physical Activity: Not on file  Stress: Not on file  Social Connections: Not on file  Intimate Partner Violence: Not on file   History reviewed. No pertinent family  history. Past Surgical History:  Procedure Laterality Date   DENTAL SURGERY     MOUTH SURGERY     ORIF RADIAL FRACTURE Right 12/05/2016   Procedure: RIGHT OPEN REDUCTION AND INTERNAL FIXATION AND REPAIR AS INDICATED, RIGHT RADIUS AND ULNA;  Surgeon: Bradly Bienenstock, MD;  Location: [facility] OR;  Service: Orthopedics;  Laterality: Right;      Mardella Layman, MD 12/31/22 1123

## 2022-12-31 NOTE — ED Notes (Signed)
Patient is being discharged from the Urgent Care and sent to the Emergency Department via [facility] . Per Dr. Tracie Harrier, patient is in need of higher level of care due to in and out of SVT. Patient is aware and verbalizes understanding of plan of care.  Vitals:   12/31/22 1008  BP: (!) 150/101  Pulse: 97  Resp: 16  Temp: (!) 97.5 F (36.4 C)  SpO2: 97%

## 2022-12-31 NOTE — ED Notes (Signed)
Charge(Jamie) has been notified of patient's arrival.

## 2022-12-31 NOTE — ED Provider Notes (Addendum)
[place] EMERGENCY DEPARTMENT AT [facility] Provider Note   CSN: [id number] Arrival date & time: 12/31/22  1130     History  Chief Complaint  Patient presents with   SVT    Megan Austin is a 52 y.o. female.  HPI   52 year old female presenting to the emergency department with episodes of SVT.  The patient states that it has been going on since this morning.  She denies any prior history of this.  She states that her symptoms will resolve with vagal maneuvers.  She endorses some lightheadedness while it is occurring but denies any significant chest pain or pressure.  She denies any shortness of breath.  She denies any cough, fever or chills.  No sick contacts, otherwise she had been well-appearing prior to this coming on.  She states that she has been well-hydrated and eating and drinking normally.  No abdominal pain or discomfort.  Home Medications Prior to Admission medications   Not on File      Allergies    Amoxicillin, Ceclor [cefaclor], Keflex [cephalexin], Lorabid [loracarbef], and Septra [sulfamethoxazole-trimethoprim]    Review of Systems   Review of Systems  Cardiovascular:  Positive for palpitations.  All other systems reviewed and are negative.   Physical Exam Updated Vital Signs BP (!) 114/91   Pulse (!) 185   Resp 15   LMP 08/31/2019   SpO2 100%  Physical Exam Vitals and nursing note reviewed.  Constitutional:      General: She is not in acute distress.    Appearance: She is well-developed.  HENT:     Head: Normocephalic and atraumatic.  Eyes:     Conjunctiva/sclera: Conjunctivae normal.  Cardiovascular:     Rate and Rhythm: Regular rhythm. Tachycardia present.     Pulses: Normal pulses.  Pulmonary:     Effort: Pulmonary effort is normal. No respiratory distress.     Breath sounds: Normal breath sounds.  Abdominal:     Palpations: Abdomen is soft.     Tenderness: There is no abdominal tenderness.  Musculoskeletal:        General:  No swelling.     Cervical back: Neck supple.  Skin:    General: Skin is warm and dry.     Capillary Refill: Capillary refill takes less than 2 seconds.  Neurological:     Mental Status: She is alert.  Psychiatric:        Mood and Affect: Mood normal.     ED Results / Procedures / Treatments   Labs (all labs ordered are listed, but only abnormal results are displayed) Labs Reviewed  CBC WITH DIFFERENTIAL/PLATELET - Abnormal; Notable for the following components:      Result Value   RBC 5.40 (*)    Hemoglobin 16.2 (*)    HCT 49.2 (*)    All other components within normal limits  BASIC METABOLIC PANEL - Abnormal; Notable for the following components:   Glucose, Bld 215 (*)    All other components within normal limits  D-DIMER, QUANTITATIVE - Abnormal; Notable for the following components:   D-Dimer, Quant 1.06 (*)    All other components within normal limits  RESP PANEL BY RT-PCR (RSV, FLU A&B, COVID)  RVPGX2  MAGNESIUM  TSH  T4, FREE  TROPONIN I (HIGH SENSITIVITY)    EKG EKG Interpretation Date/Time:  Monday December 31 2022 11:39:44 EDT Ventricular Rate:  205 PR Interval:  138 QRS Duration:  90 QT Interval:  261 QTC Calculation: 488  R Axis:   -15  Text Interpretation: Supraventricular tachycardia Ventricular premature complex Borderline left axis deviation Low voltage, precordial leads Anteroseptal infarct, old Repolarization abnormality, prob rate related Confirmed by Ernie Avena ([id number]) on 12/31/2022 11:40:20 AM  Radiology DG Chest Portable 1 View  Result Date: 12/31/2022 CLINICAL DATA:  Supraventricular tachycardia. EXAM: PORTABLE CHEST 1 VIEW COMPARISON:  October 24, 2019. FINDINGS: Stable cardiomediastinal silhouette. Both lungs are clear. The visualized skeletal structures are unremarkable. IMPRESSION: No active disease. Electronically Signed   By: Lupita Raider M.D.   On: 12/31/2022 13:48    Procedures Procedures    Medications Ordered in ED Medications   metoprolol tartrate (LOPRESSOR) injection 5 mg (has no administration in time range)  lactated ringers bolus 1,000 mL (1,000 mLs Intravenous New Bag/Given 12/31/22 1150)    ED Course/ Medical Decision Making/ A&P Clinical Course as of 12/31/22 1507  Mon Dec 31, 2022  1318 D-Dimer, Quant(!): 1.06 [JL]  1318 Hemoglobin(!): 16.2 [JL]    Clinical Course User Index [JL] Ernie Avena, MD                                 Medical Decision Making Amount and/or Complexity of Data Reviewed Labs: ordered. Decision-making details documented in ED Course. Radiology: ordered.  Risk Prescription drug management. Decision regarding hospitalization.    52 year old female presenting to the emergency department with episodes of SVT.  The patient states that it has been going on since this morning.  She denies any prior history of this.  She states that her symptoms will resolve with vagal maneuvers.  She endorses some lightheadedness while it is occurring but denies any significant chest pain or pressure.  She denies any shortness of breath.  She denies any cough, fever or chills.  No sick contacts, otherwise she had been well-appearing prior to this coming on.  She states that she has been well-hydrated and eating and drinking normally.  No abdominal pain or discomfort.    On arrival, the patient was intermittently in brief episodes of SVT with heart rates up to the 200s.  The symptoms were resolved when the patient would bear down and vagal.  EKG confirmed SVT.  Patient has no prior history of this.  She denies any chest pain or shortness of breath.  She was hemodynamically stable.  She was placed on cardiac telemetry.  Chest x-ray revealed clear lungs.  Labs: D-dimer elevated at 1.06, COVID-19, influenza, RSV PCR testing negative, BMP with hyperglycemia to 215, otherwise unremarkable, CBC with evidence of hemoconcentration with a hemoglobin of 16.2, WBC 9.4, magnesium normal at 2.2, thyroid  function studies collected and pending.  The patient was administered a 1 L IV fluid bolus.  She continued to go in and out of SVT.  Given her elevated D-dimer, CT angiogram was ordered to evaluate for PE.  CTA PE: Negative for PE. No other acute abnormality.  5 mg of IV metoprolol was ordered after consultation with cardiology who will have electrophysiology, and evaluate the patient.  CTA imaging was pending at time of signout.  Thyroid function studies resulted negative with a normal TSH.  Physiology evaluated the patient and plan to admit the patient for further monitoring and workup.  The patient was subsequently admitted in stable condition.   Final Clinical Impression(s) / ED Diagnoses Final diagnoses:  SVT (supraventricular tachycardia)    Rx / DC Orders ED Discharge Orders  None         Ernie Avena, MD 12/31/22 4098    Ernie Avena, MD 12/31/22 [phone]

## 2022-12-31 NOTE — Progress Notes (Signed)
  Patient Name: Megan Austin Date of Encounter: 01/01/2023   Primary Cardiologist: None Electrophysiologist: New to Dr. Lalla Brothers  Interval Summary   Feeling better this am, continued to have paroxysms overnight though not as fast.   Not terribly surprised to find out she has diabetes, but refuses insulin.   Vital Signs    Vitals:   12/31/22 1751 12/31/22 2009 01/01/23 0043 01/01/23 0440  BP: (!) 118/99 (!) 125/96 112/72 97/73  Pulse: 74 82 79 83  Resp: 16 17 16 19   Temp: 97.7 F (36.5 C) 98.1 F (36.7 C) 98.4 F (36.9 C) 98.4 F (36.9 C)  TempSrc: Oral Oral Oral Oral  SpO2: 99% 100% 99% 99%  Weight:      Height:       No intake or output data in the 24 hours ending 01/01/23 0758 Filed Weights   12/31/22 1545  Weight: 77.1 kg    Physical Exam    GEN- The patient is well appearing, alert and oriented x 3 today.   Lungs- Clear to ausculation bilaterally, normal work of breathing Cardiac- Regular rate and rhythm, no murmurs, rubs or gallops GI- soft, NT, ND, + BS Extremities- no clubbing or cyanosis. No edema  Telemetry    NSR 70-80s with PSVT 160-180s (personally reviewed)  Hospital Course    Megan Austin is a 52 y.o. female admitted for tachy palpitations and paroxysmal SVT. Was incidentally found to have new onset diabetes with Hgb A1c > 9.  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 Continue lopressor 25 mg TID Add flecainide 50 mg BID. May need to re-adjust if Echo abnormal TFTs ok.  Long term, is likely to benefit from ablation  New Onset diabetes Hgb A1c 9.5 Pt refuses insulin DM coordinator consult; will need new PCP and very close follow up.    For questions or updates, please contact [facility] Please consult [email or web address] for contact info under Cardiology/STEMI.  Signed, Graciella Freer, PA-C  01/01/2023, 7:58 AM

## 2022-12-31 NOTE — H&P (Addendum)
   ELECTROPHYSIOLOGY H&P NOTE    Patient ID: Megan Austin MRN: [id number], DOB/AGE: 1971/01/19 52 y.o.  Admit date: 12/31/2022 Date of Consult: 12/31/2022  Primary Physician: Pcp, No Primary Cardiologist: None  Electrophysiologist: New   Referring Provider: Dr. Karene Fry  Patient Profile: Megan Austin is a 52 y.o. female with a history of arthritis and prior gestation diabetes who is being seen today for the evaluation of SVT at the request of Dr. Karene Fry.  HPI:  Megan Austin is a 52 y.o. female who presented to Urgent care after noting lightheadedness and tachy-palpitations.  No history of the same, states last week was swimming in the ocean.  Of note, she checked her blood glucose at home and noted it to be >400.   SVT on arrival showed paroxysms of SVT. EP asked to see due to HRs at times > 200 bpm.  Currently, she is sitting at rest in bed, continuing to go in and out of SVT. Denies history of same prior to today. She is able to illicit the symptoms by turning her head, she is able to convert herself with vagal maneuvers.   Prior chest CTA without mention of CAD. CTA today pending with + D-dimer and tachycardia.   Labs Potassium4.4 (08/26 1145) Magnesium  2.2 (08/26 1145) Creatinine, ser  0.82 (08/26 1145) PLT  350 (08/26 1145) HGB  16.2* (08/26 1145) WBC 9.4 (08/26 1145) Troponin I (High Sensitivity)7 (08/26 1324).    Allergies, Medical, Surgical, Social, and Family Histories have been reviewed and are referenced here-in when relevant for medical decision making.    Physical Exam: Vitals:   12/31/22 1200 12/31/22 1230 12/31/22 1245 12/31/22 1545  BP: (!) 145/85  (!) 114/91 130/88  Pulse: (!) 188 (!) 164 (!) 185 (!) 155  Resp: (!) 31 17 15 16   Temp:    97.7 F (36.5 C)  TempSrc:    Oral  SpO2: 99% 100% 100% 99%  Weight:    77.1 kg  Height:    5\' 4"  (1.626 m)    GEN- NAD, A&O x 3, normal affect HEENT: Normocephalic, atraumatic Lungs- CTAB, Normal effort.  Heart-  Regular rate and rhythm, No M/G/R.  GI- Soft, NT, ND.  Extremities- No clubbing, cyanosis, or edema   Radiology/Studies: DG Chest Portable 1 View  Result Date: 12/31/2022 CLINICAL DATA:  Supraventricular tachycardia. EXAM: PORTABLE CHEST 1 VIEW COMPARISON:  October 24, 2019. FINDINGS: Stable cardiomediastinal silhouette. Both lungs are clear. The visualized skeletal structures are unremarkable. IMPRESSION: No active disease. Electronically Signed   By: Lupita Raider M.D.   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  TFTs WNL CBC somewhat concentrated, BMET OK though BG elevated as below.  Consider flecainide based on CTA results.  Long term, is likely to benefit from ablation.   +D dimer CTA negative for PE.   Hyperglycemia Add Hgb A1c  Will admit to observation  For questions or updates, please contact [facility] Please consult [email or web address] for contact info under Cardiology/STEMI.  Dustin Flock, PA-C  12/31/2022 3:52 PM

## 2022-12-31 NOTE — ED Triage Notes (Signed)
Pt presents to UC w/ c/o fluttering in her chest starting at 0630 this morning and dizziness. Pt reports she had coffee this morning. Pt states she's been at the beach recently.

## 2023-01-01 ENCOUNTER — Observation Stay (HOSPITAL_COMMUNITY): Payer: Medicaid Other

## 2023-01-01 DIAGNOSIS — I498 Other specified cardiac arrhythmias: Secondary | ICD-10-CM

## 2023-01-01 DIAGNOSIS — Z79899 Other long term (current) drug therapy: Secondary | ICD-10-CM | POA: Diagnosis not present

## 2023-01-01 DIAGNOSIS — Z5320 Procedure and treatment not carried out because of patient's decision for unspecified reasons: Secondary | ICD-10-CM | POA: Diagnosis not present

## 2023-01-01 DIAGNOSIS — E1165 Type 2 diabetes mellitus with hyperglycemia: Secondary | ICD-10-CM | POA: Diagnosis not present

## 2023-01-01 DIAGNOSIS — Z8632 Personal history of gestational diabetes: Secondary | ICD-10-CM | POA: Diagnosis not present

## 2023-01-01 DIAGNOSIS — Z1152 Encounter for screening for COVID-19: Secondary | ICD-10-CM | POA: Diagnosis not present

## 2023-01-01 DIAGNOSIS — Z882 Allergy status to sulfonamides status: Secondary | ICD-10-CM | POA: Diagnosis not present

## 2023-01-01 DIAGNOSIS — I4719 Other supraventricular tachycardia: Secondary | ICD-10-CM | POA: Diagnosis not present

## 2023-01-01 DIAGNOSIS — I471 Supraventricular tachycardia, unspecified: Secondary | ICD-10-CM | POA: Diagnosis not present

## 2023-01-01 LAB — CBC
HCT: 45.9 % (ref 36.0–46.0)
Hemoglobin: 15.4 g/dL — ABNORMAL HIGH (ref 12.0–15.0)
MCH: 30.6 pg (ref 26.0–34.0)
MCHC: 33.6 g/dL (ref 30.0–36.0)
MCV: 91.1 fL (ref 80.0–100.0)
Platelets: 318 10*3/uL (ref 150–400)
RBC: 5.04 MIL/uL (ref 3.87–5.11)
RDW: 12.6 % (ref 11.5–15.5)
WBC: 10.6 10*3/uL — ABNORMAL HIGH (ref 4.0–10.5)
nRBC: 0 % (ref 0.0–0.2)

## 2023-01-01 LAB — ECHOCARDIOGRAM COMPLETE
AR max vel: 2.06 cm2
AV Area VTI: 1.88 cm2
AV Area mean vel: 1.96 cm2
AV Mean grad: 3 mmHg
AV Peak grad: 5.7 mmHg
Ao pk vel: 1.19 m/s
Area-P 1/2: 3.91 cm2
Height: 64 in
S' Lateral: 3.1 cm
Weight: 2720 [oz_av]

## 2023-01-01 LAB — BASIC METABOLIC PANEL
Anion gap: 10 (ref 5–15)
BUN: 14 mg/dL (ref 6–20)
CO2: 25 mmol/L (ref 22–32)
Calcium: 9 mg/dL (ref 8.9–10.3)
Chloride: 102 mmol/L (ref 98–111)
Creatinine, Ser: 0.94 mg/dL (ref 0.44–1.00)
GFR, Estimated: >60 mL/min (ref >60)
Glucose, Bld: 207 mg/dL — ABNORMAL HIGH (ref 70–99)
Potassium: 4 mmol/L (ref 3.5–5.1)
Sodium: 137 mmol/L (ref 135–145)

## 2023-01-01 LAB — GLUCOSE, CAPILLARY
Glucose-Capillary: 220 mg/dL — ABNORMAL HIGH (ref 70–99)
Glucose-Capillary: 231 mg/dL — ABNORMAL HIGH (ref 70–99)
Glucose-Capillary: 213 mg/dL — ABNORMAL HIGH (ref 70–99)
Glucose-Capillary: 247 mg/dL — ABNORMAL HIGH (ref 70–99)

## 2023-01-01 LAB — HIV ANTIBODY (ROUTINE TESTING W REFLEX): HIV Screen 4th Generation wRfx: NONREACTIVE

## 2023-01-01 MED ORDER — FLECAINIDE ACETATE 50 MG PO TABS
100.0000 mg | ORAL_TABLET | Freq: Two times a day (BID) | ORAL | Status: DC
  Administered 2023-01-01 – 2023-01-02 (×2): 100 mg via ORAL
  Filled 2023-01-01 (×2): qty 2

## 2023-01-01 MED ORDER — ACETAMINOPHEN 325 MG PO TABS
650.0000 mg | ORAL_TABLET | Freq: Four times a day (QID) | ORAL | Status: DC | PRN
  Administered 2023-01-01 – 2023-01-02 (×2): 650 mg via ORAL
  Filled 2023-01-01 (×2): qty 2

## 2023-01-01 MED ORDER — DIPHENHYDRAMINE HCL 25 MG PO CAPS: 25.0000 mg | ORAL_CAPSULE | Freq: Four times a day (QID) | ORAL | Status: DC | PRN

## 2023-01-01 MED ORDER — METOPROLOL SUCCINATE ER 50 MG PO TB24
50.0000 mg | ORAL_TABLET | Freq: Two times a day (BID) | ORAL | Status: DC
  Administered 2023-01-01 – 2023-01-02 (×2): 50 mg via ORAL
  Filled 2023-01-01 (×2): qty 1

## 2023-01-01 MED ORDER — OFF THE BEAT BOOK
Freq: Once | Status: AC
  Filled 2023-01-01: qty 1

## 2023-01-01 MED ORDER — METOPROLOL SUCCINATE ER 25 MG PO TB24
25.0000 mg | ORAL_TABLET | Freq: Two times a day (BID) | ORAL | Status: DC
Start: 2023-01-01 — End: 2023-01-01

## 2023-01-01 MED ORDER — INSULIN ASPART 100 UNIT/ML IJ SOLN: 0.0000 [IU] | Freq: Three times a day (TID) | INTRAMUSCULAR | Status: DC

## 2023-01-01 MED ORDER — OFF THE BEAT BOOK
Freq: Once | Status: DC
  Filled 2023-01-01: qty 1

## 2023-01-01 MED ORDER — METOPROLOL TARTRATE 25 MG PO TABS
25.0000 mg | ORAL_TABLET | Freq: Once | ORAL | Status: AC
  Administered 2023-01-01: 25 mg via ORAL
  Filled 2023-01-01: qty 1

## 2023-01-01 MED ORDER — FLECAINIDE ACETATE 50 MG PO TABS
50.0000 mg | ORAL_TABLET | Freq: Two times a day (BID) | ORAL | Status: DC
  Administered 2023-01-01: 50 mg via ORAL
  Filled 2023-01-01: qty 1

## 2023-01-01 MED ORDER — LIVING WELL WITH DIABETES BOOK
Freq: Once | Status: AC
  Filled 2023-01-01: qty 1

## 2023-01-01 NOTE — Inpatient Diabetes Management (Addendum)
Inpatient Diabetes Program Recommendations  AACE/ADA: New Consensus Statement on Inpatient Glycemic Control   Target Ranges:  Prepandial:   less than 140 mg/dL      Peak postprandial:   less than 180 mg/dL (1-2 hours)      Critically ill patients:  140 - 180 mg/dL    Latest Reference Range & Units 01/01/23 07:49  Glucose-Capillary 70 - 99 mg/dL 562 (H)    Latest Reference Range & Units 12/31/22 11:45 12/31/22 15:39 01/01/23 05:11  Glucose 70 - 99 mg/dL 130 (H)  865 (H)  Hemoglobin A1C 4.8 - 5.6 %  9.5 (H)    Review of Glycemic Control  Diabetes history: No; new DM dx this admission Outpatient Diabetes medications: NA Current orders for Inpatient glycemic control: None  Inpatient Diabetes Program Recommendations:    Insulin: Per chart, patient refuses insulin.  Oral DM medication: Would recommend to prescribe oral DM medication at discharge. Patient states she does not want to take Metformin outpatient. May want to consider prescribing Amaryl 1 mg daily and have patient establish care with PCP for consistent follow up.   HbgA1C: A1C 9.5% on 12/31/22 indicating an average glucose of 226 mg/dl over the past 2-3 months.  Outpatient DM: Please provide Rx for glucose monitoring kit (#[id number]) at discharge.  NOTE: Patient with no prior documented hx of DM. Patient admitted with SVT with initial glucose of 215 mg/dl on 9/52/84. A1C 9.5% on 12/31/22 indicating DM and patient is being newly dx with DM this admission. Per notes, patient refuses to take insulin. Currently no orders for glycemic control. Ordered Living Well with DM book and RD consult for diet education. Will plan to speak with patient today.  Addendum 01/01/23@14 :14-Spoke with patient at bedside regarding new DM dx. Patient states that she had gestational diabetes which was diet controlled and her husband has DM so she is very familiar with DM management. Patient reports that she does not have a PCP and she has not had any blood  work done since she was in the hospital in June 2021. Patient has insurance and noted per [facility] note that appointment will be made with [facility] to establish care. Patient states that is where her husband goes to care.  Patient admits that she has been eating high carb foods (pizza, cookies, pasta, and ice cream; also drinking juice). Patient reports that over the summer they have went on vacation several times and she has indulged in foods that she may not normally eat much of.    Discussed A1C results (9.5% on 12/31/22) and explained that current A1C indicates an average glucose of 226 mg/dl over the past 2-3 months. Discussed glucose and A1C goals. Discussed importance of checking CBGs and maintaining good CBG control to prevent long-term and short-term complications. Explained how hyperglycemia leads to damage within blood vessels which lead to the common complications seen with uncontrolled diabetes. Stressed to the patient the importance of improving glycemic control to prevent further complications from uncontrolled diabetes. Discussed impact of nutrition, exercise, stress, sickness, and medications on diabetes control.  Discussed carbohydrates, carbohydrate goals per day and meal, along with portion sizes.  Patient states she will plan to cut juice out and start modifying and limiting carbohydrates per meal. Patient is open to taking oral DM medication but does not want to take Metformin.  Discussed that she will likely be prescribed oral DM medication and will be asked to start checking glucose at home and follow up with clinic and follow  up consistently.  Patient verbalized understanding of information discussed and reports no further questions at this time related to diabetes.   Thanks, Orlando Penner, RN, MSN, CDCES Diabetes Coordinator Inpatient Diabetes Program [phone] (Team Pager from 8am to 5pm)

## 2023-01-01 NOTE — Progress Notes (Addendum)
  Pt continues to have paroxysms of SVT  Will give dose of Toprol 50 mg this evening and increase flecianide to 100 mg BID for the evening dose.  Pt refuses metformin and has previously demonstrated sulfa allergy (amaryl) . She will need close PCP follow up  to discuss options if she continues to refuse Insulin as well.   Casimiro Needle [address]" [place], [place]  01/01/2023 3:36 PM

## 2023-01-01 NOTE — Plan of Care (Signed)
  Problem: Education: Goal: Knowledge of General Education information will improve Description: Including pain rating scale, medication(s)/side effects and non-pharmacologic comfort measures Outcome: Progressing   Problem: Coping: Goal: Level of anxiety will decrease Outcome: Progressing   Problem: Nutrition: Goal: Adequate nutrition will be maintained Outcome: Completed/Met   Problem: Elimination: Goal: Will not experience complications related to bowel motility Outcome: Completed/Met Goal: Will not experience complications related to urinary retention Outcome: Completed/Met   Problem: Pain Managment: Goal: General experience of comfort will improve Outcome: Completed/Met

## 2023-01-01 NOTE — Plan of Care (Signed)
  RD consulted for nutrition education regarding diabetes.   Lab Results  Component Value Date   HGBA1C 9.5 (H) 12/31/2022    RD provided "Carbohydrate Counting for People with Diabetes" handout from the Academy of Nutrition and Dietetics. Discussed different food groups and their effects on blood sugar, emphasizing carbohydrate-containing foods. Provided list of carbohydrates and recommended serving sizes of common foods.  Discussed importance of controlled and consistent carbohydrate intake throughout the day. Provided examples of ways to balance meals/snacks and encouraged intake of high-fiber, whole grain complex carbohydrates. Teach back method used.  Expect optimal compliance.  Body mass index is 29.18 kg/m. Pt meets criteria for overweight based on current BMI.  Current diet order is Heart/Carb Modified. Labs and medications reviewed. No further nutrition interventions warranted at this time. RD contact information provided. If additional nutrition issues arise, please re-consult RD.  Leodis Rains, RDN, LDN  Clinical Nutrition

## 2023-01-01 NOTE — Discharge Summary (Incomplete)
ELECTROPHYSIOLOGY DISCHARGE SUMMARY    Patient ID: Megan Austin,  MRN: [id number], DOB/AGE: [birth year] 54 y.o.  Admit date: 12/31/2022 Discharge date: 01/02/23   Primary Care Physician: Pcp, No  Primary Cardiologist: None  Electrophysiologist: Dr. Lalla Brothers   Primary Discharge Diagnosis:  SVT  Secondary Discharge Diagnosis:  DM2, new diagnosis  Procedures This Admission:   Echocardiogram 01/01/23 LVEF 55-60%, Grade 1 DD   Brief HPI: Megan Austin is a 52 y.o. female with no prior significant cardiac history admitted for tachy palpitations and found to have SVT.   Hospital Course:  The patient was admitted and found to have SVT at rates > 190 bpm. She was started on lopressor with modest improvement, and flecainide was added. Echo with normal EF as above.  She reported blood sugars > 400 at home when she first starting feeling back, check with her husbands glucometer. Hgb A1c 9.5. Patient refused insulin and metformin. DM Coordinator also recommend amaryl, but pt has previously had a sulfa allergy. Recommendation is for Tradjenta 5 mg daily and glucose monitoring supplies and pt was agreeable. The importance of close and consistent PCP follow up was stressed multiple times.     Flecainide and Metoprolol were titrated as tolerated to effect. They were monitored on telemetry overnight which demonstrated much improved arrhyhtmia.   The patient was examined and considered to be stable for discharge.  Wound care and restrictions were reviewed with the patient.  The patient will be seen back by EP APP in 2-3 weeks for post hospital care.   Physical Exam: Vitals:   01/01/23 1548 01/01/23 2007 01/02/23 0551 01/02/23 0751  BP: 104/74 120/89 114/79 109/82  Pulse: 84 78 80 75  Resp: 16 18 16 16   Temp: 98.7 F (37.1 C) 97.9 F (36.6 C) 98.3 F (36.8 C) 97.9 F (36.6 C)  TempSrc: Oral Oral Oral Oral  SpO2: 100% 100% 100% 98%  Weight:      Height:        GEN- NAD. A&O x 3.  HEENT:  Normocephalic, atraumatic Lungs- CTAB, Normal effort.  Heart- RRR, No M/G/R.  GI- Soft, NT, ND.  Extremities- No clubbing, cyanosis, or edema; Groin without hematoma   Discharge Medications:  Allergies as of 01/02/2023       Reactions   Amoxicillin Hives   Ceclor [cefaclor] Hives   Keflex [cephalexin] Hives   Lorabid [loracarbef] Hives   Septra [sulfamethoxazole-trimethoprim] Hives        Medication List     STOP taking these medications    ibuprofen 200 MG tablet Commonly known as: ADVIL       TAKE these medications    acetaminophen 325 MG tablet Commonly known as: TYLENOL Take 2 tablets (650 mg total) by mouth every 6 (six) hours as needed for moderate pain.   flecainide 100 MG tablet Commonly known as: TAMBOCOR Take 1 tablet (100 mg total) by mouth every 12 (twelve) hours.   metoprolol succinate 50 MG 24 hr tablet Commonly known as: TOPROL-XL Take 1 tablet (50 mg total) by mouth 2 (two) times daily. Take with or immediately following a meal.        Disposition:    Follow-up Information     [facility] Follow up.   Specialty: Cardiology Why: on 9/19 at 0820 for post hospital follow up - Cardiology Contact information: [address], [address] [place] [address] [phone]        [facility] Follow up.   Specialty: Family Medicine Why: on 9/9 at 350 for post hospital follow up - PCP Contact information: [address] [place] [facility] [address] [phone]                Duration of Discharge Encounter: Greater than 30 minutes including physician time.  Dustin Flock, PA-C  01/02/2023 8:14 AM

## 2023-01-01 NOTE — TOC CM/SW Note (Signed)
Transition of Care [facility]) - Inpatient Brief Assessment   Patient Details  Name: Megan Austin MRN: [id number] Date of Birth: 05-Aug-1970  [facility]) CM/SW Contact:    Gala Lewandowsky, RN Phone Number: 01/01/2023, 11:40 AM   Clinical Narrative: Patient presented for SVT. PTA patient was from home with spouse. Patient has Medicaid; however, no PCP. Case Manager did discuss with patient regarding Medicaid and the card states assigned to the [facility]. No appointments are available at the [facility]-appointment scheduled at the [facility]. Information placed on the AVS. No further needs identified at this time.     Transition of Care Asessment: Insurance and Status: Insurance coverage has been reviewed Patient has primary care physician: Yes Home environment has been reviewed: reviewed Prior level of function:: independent Prior/Current Home Services: No current home services Social Determinants of Health Reivew: SDOH reviewed no interventions necessary Readmission risk has been reviewed: Yes Transition of care needs: transition of care needs identified, TOC will continue to follow

## 2023-01-01 NOTE — Progress Notes (Signed)
Pt continues to have frequent episodes of PSVT-with HR up to 150-170 nonsustained/self terminating. Pt c/o intermittent dizziness when HR is up. Ambulated to BR without difficulty. Will continue to monitor. Dierdre Highman, RN

## 2023-01-02 ENCOUNTER — Other Ambulatory Visit (HOSPITAL_COMMUNITY): Payer: Self-pay

## 2023-01-02 DIAGNOSIS — I471 Supraventricular tachycardia, unspecified: Secondary | ICD-10-CM | POA: Diagnosis not present

## 2023-01-02 LAB — GLUCOSE, CAPILLARY: Glucose-Capillary: 217 mg/dL — ABNORMAL HIGH (ref 70–99)

## 2023-01-02 MED ORDER — METOPROLOL SUCCINATE ER 50 MG PO TB24
50.0000 mg | ORAL_TABLET | Freq: Two times a day (BID) | ORAL | 6 refills | Status: DC
  Filled 2023-01-02: qty 60, 30d supply, fill #0

## 2023-01-02 MED ORDER — FLECAINIDE ACETATE 100 MG PO TABS
100.0000 mg | ORAL_TABLET | Freq: Two times a day (BID) | ORAL | 1 refills | Status: DC
  Filled 2023-01-02: qty 60, 30d supply, fill #0

## 2023-01-02 MED ORDER — ACETAMINOPHEN 325 MG PO TABS: 650.0000 mg | ORAL_TABLET | Freq: Four times a day (QID) | ORAL | Status: DC | PRN

## 2023-01-02 MED ORDER — BLOOD GLUCOSE TEST VI STRP
1.0000 | ORAL_STRIP | Freq: Three times a day (TID) | 0 refills | Status: AC
  Filled 2023-01-02: qty 100, 34d supply, fill #0

## 2023-01-02 MED ORDER — BLOOD GLUCOSE MONITOR SYSTEM W/DEVICE KIT
1.0000 | PACK | Freq: Three times a day (TID) | 0 refills | Status: AC
  Filled 2023-01-02: qty 1, 30d supply, fill #0

## 2023-01-02 MED ORDER — ACCU-CHEK SOFTCLIX LANCETS MISC
1.0000 | Freq: Three times a day (TID) | 0 refills | Status: DC
  Filled 2023-01-02: qty 100, 30d supply, fill #0

## 2023-01-02 MED ORDER — LANCET DEVICE MISC
1.0000 | Freq: Three times a day (TID) | 0 refills | Status: AC
  Filled 2023-01-02: qty 1, 30d supply, fill #0

## 2023-01-02 MED ORDER — LINAGLIPTIN 5 MG PO TABS
5.0000 mg | ORAL_TABLET | Freq: Every day | ORAL | 0 refills | Status: DC
  Filled 2023-01-02 – 2023-01-03 (×2): qty 30, 30d supply, fill #0

## 2023-01-02 NOTE — Plan of Care (Signed)
Patient discharged home with new medications.  Educated at bedside regarding discharge instructions, follow-up appointments, medications and reasons to seek care now.  No active problems reported per patient.

## 2023-01-02 NOTE — Inpatient Diabetes Management (Signed)
Inpatient Diabetes Program Recommendations  AACE/ADA: New Consensus Statement on Inpatient Glycemic Control (2015)  Target Ranges:  Prepandial:   less than 140 mg/dL      Peak postprandial:   less than 180 mg/dL (1-2 hours)      Critically ill patients:  140 - 180 mg/dL    Latest Reference Range & Units 01/01/23 07:49 01/01/23 12:04 01/01/23 15:46 01/01/23 22:05 01/02/23 07:49  Glucose-Capillary 70 - 99 mg/dL 161 (H) 096 (H) 045 (H) 247 (H) 217 (H)    Latest Reference Range & Units 12/31/22 15:39  Hemoglobin A1C 4.8 - 5.6 % 9.5 (H)   Review of Glycemic Control  Diabetes history: No; new DM dx this admission Outpatient Diabetes medications: NA Current orders for Inpatient glycemic control: None  Inpatient Diabetes Program Recommendations:    Oral DM medication: Patient does not want to take Metformin and due to sulfa allergy provider does not want to order Amaryl. Please consider discharging on Tradjenta 5 mg daily and have patient follow with provider at clinic regarding DM management.  At discharge please provide Rx for glucose monitoring kit (#[id number]) and Tradjenta 5 mg daily.  NOTE: communicated with Maxine Glenn, PA via chat message regarding recommendation to discharge on Tradjenta.  Thanks, Orlando Penner, RN, MSN, CDCES Diabetes Coordinator Inpatient Diabetes Program [phone] (Team Pager from 8am to 5pm)

## 2023-01-03 ENCOUNTER — Telehealth: Payer: Self-pay | Admitting: Student

## 2023-01-03 ENCOUNTER — Other Ambulatory Visit (HOSPITAL_COMMUNITY): Payer: Self-pay

## 2023-01-03 ENCOUNTER — Ambulatory Visit: Payer: Self-pay

## 2023-01-03 NOTE — Telephone Encounter (Signed)
     Chief Complaint: Missing medication Symptoms: none Frequency: yesterday Pertinent Negatives: Patient denies  Disposition: [] ED /[] Urgent Care (no appt availability in office) / [] Appointment(In office/virtual)/ []  [facility]/ [] Home Care/ [] Refused Recommended Disposition /[] [facility]/ [x]  Follow-up with PCP Additional Notes: Pt was released for hospital yesterday. Provider Graciella Freer wrote an RX for Tradjenta. Insurance denied this medication and asked provider to write less expensive medication. Pt is allergic to Sulfa and cannot take alternate medication.  Pharmacy has called provider, and has not gotten a response.   Called [facility] and spoke to Chanel. Chanel took information and will forward to Rozann Lesches for follow up.     Summary: Pt went to hospital but Rx not at pharmacy   Pt stated she went to the hospital and she was prescribed a medication but when she went to her pharmacy she was told that they had not received any prescriptions. Cb# [phone]     Reason for Disposition  [1] Prescription not at pharmacy AND [2] was prescribed by PCP recently  (Exception: [facility] has access to EMR and prescription is recorded there. Go to Home Care and confirm for pharmacy.)  Answer Assessment - Initial Assessment Questions 1. DRUG NAME: "What medicine do you need to have refilled?"     Tradjenta 2. REFILLS REMAINING: "How many refills are remaining?" (Note: The label on the medicine or pill bottle will show how many refills are remaining. If there are no refills remaining, then a renewal may be needed.)     none 3. EXPIRATION DATE: "What is the expiration date?" (Note: The label states when the prescription will expire, and thus can no longer be refilled.)     *No Answer* 4. PRESCRIBING HCP: "Who prescribed it?" Reason: If prescribed by specialist, call should be referred to that group.     Mariam [place] 5. SYMPTOMS: "Do  you have any symptoms?"     no  Protocols used: Medication Refill and Renewal Call-A-AH

## 2023-01-03 NOTE — Telephone Encounter (Signed)
Patient states her insurance will not cover Tradjenta and she will need something less expensive.

## 2023-01-03 NOTE — Telephone Encounter (Signed)
Pt is requesting a callback regarding issue she's having with getting linagliptin (TRADJENTA) 5 MG TABS tablet. Please advise

## 2023-01-03 NOTE — Telephone Encounter (Signed)
Message from provider-  Her other option is to start Metformin 500 mg daily, which she refused in the hospital.   She cannot take Amaryl with sulfa allergy and also refused to start insulin given her very high Hgb A1c.   If she is agreeable to starting metformin, 500 mg daily - 1 month supply no refills - needs to keep 9/9 PCP visit   London Pepper is another option, but similarly to Tradjenta her insurance may not cover unless she has tried Metformin. Or with a prior authorization.   If she is not willing to try Metformin, she will need to call her upcoming PCP for other recommendations.    Spoke with patient and she refused metformin. Did advise of provider recommendations. She verbalized understanding.

## 2023-01-14 ENCOUNTER — Inpatient Hospital Stay (INDEPENDENT_AMBULATORY_CARE_PROVIDER_SITE_OTHER): Payer: Medicaid Other | Admitting: Primary Care

## 2023-01-15 ENCOUNTER — Inpatient Hospital Stay (INDEPENDENT_AMBULATORY_CARE_PROVIDER_SITE_OTHER): Payer: Medicaid Other | Admitting: Primary Care

## 2023-01-23 ENCOUNTER — Encounter: Payer: Self-pay | Admitting: Student

## 2023-01-23 NOTE — Progress Notes (Unsigned)
Electrophysiology Office Note:   Date:  01/24/2023  ID:  Megan Austin, DOB 11/26/1970, MRN [id number]  Primary Cardiologist: None Electrophysiologist: Lanier Prude, MD      History of Present Illness:   Megan Austin is a 52 y.o. female with h/o palpitations due to SVT, DM II seen today for post hospital electrophysiology follow up.   The patient was admitted 8/26-8/28/24 with SVT, rates 190 bpm. She was started on lopressor with modest improvement in HR and flecainide was added.  ECHO showed normal EF with G1DD.  Additionally, she was noted to be profoundly hyperglycemic and refused insulin and metformin.  She ultimately agreed to take Tradjenta.  Hgb A1c 9.5.   Since last being seen in our clinic the patient reports she has been taking her flecainide and toprol.  She reports she is not taking her Tradjenta due to insurance coverage.  She does not want to take metformin as her husband has GI issues with it. She has not had any break through fast rates that she is aware of, reports feeling much better. She has been trying to make dietary changes.   She denies chest pain, palpitations, dyspnea, PND, orthopnea, nausea, vomiting, dizziness, syncope, edema, weight gain, or early satiety.   Review of systems complete and found to be negative unless listed in HPI.   EP Information / Studies Reviewed:    EKG is ordered today. Personal review as below.  EKG Interpretation Date/Time:  Thursday January 24 2023 08:37:49 EDT Ventricular Rate:  61 PR Interval:  162 QRS Duration:  98 QT Interval:  442 QTC Calculation: 444 R Axis:   37  Text Interpretation: Normal sinus rhythm Confirmed by Canary Brim ([id number]) on 01/24/2023 8:42:44 AM   Studies ECHO 12/2022 > LVEF 55-60%, GI DD       Physical Exam:   VS:  BP 118/76   Pulse 61   Ht 5\' 4"  (1.626 m)   Wt 167 lb 12.8 oz (76.1 kg)   LMP 08/31/2019   SpO2 96%   BMI 28.80 kg/m    Wt Readings from Last 3 Encounters:  01/24/23 167 lb 12.8  oz (76.1 kg)  12/31/22 170 lb (77.1 kg)  10/24/19 179 lb 14.3 oz (81.6 kg)     GEN: pleasant, well developed / nourished, female in no acute distress NECK: No JVD; No carotid bruits CARDIAC: Regular rate and rhythm, no murmurs, rubs, gallops RESPIRATORY:  Clear to auscultation without rales, wheezing or rhonchi  ABDOMEN: Soft, non-tender, non-distended EXTREMITIES:  No edema; No deformity   ASSESSMENT AND PLAN:    Paroxysmal SVT with Palpitations  -continue flecainide 100mg  BID -metoprolol 50mg  BID -EKG with stable intervals   -ETT ordered to assess intervals on Flecainide  -discussed ablation with patient > stressed she would not be a candidate for ablation with Hgb A1c as it stands. She is motivated to not be on medications. Reviewed she needs to work on her glucose control in the interim before she would be a candidate for ablation. Reviewed lab details, isolation of rhythm and being able to get to location of rhythm.   Uncontrolled DM II -glucose control per primary  -currently not on any agents -she is supposed to follow up with PCP 01/29/23  Flatus / Bowel Changes  -discussed not likely related to flecainide / toprol -consider review with PCP   Follow up with EP APP in 3 months  Signed,  Canary Brim, MSN, APRN, NP-C, AGACNP-BC [facility] -  Electrophysiology  01/24/2023, 9:09 AM

## 2023-01-24 ENCOUNTER — Encounter: Payer: Self-pay | Admitting: *Deleted

## 2023-01-24 ENCOUNTER — Encounter: Payer: Self-pay | Admitting: Student

## 2023-01-24 ENCOUNTER — Ambulatory Visit: Payer: Medicaid Other | Attending: Student | Admitting: Pulmonary Disease

## 2023-01-24 VITALS — BP 118/76 | HR 61 | Ht 64.0 in | Wt 167.8 lb

## 2023-01-24 DIAGNOSIS — Z5181 Encounter for therapeutic drug level monitoring: Secondary | ICD-10-CM | POA: Diagnosis not present

## 2023-01-24 DIAGNOSIS — I471 Supraventricular tachycardia, unspecified: Secondary | ICD-10-CM | POA: Diagnosis not present

## 2023-01-24 MED ORDER — FLECAINIDE ACETATE 100 MG PO TABS: 100.0000 mg | ORAL_TABLET | Freq: Two times a day (BID) | ORAL | 3 refills | Status: DC

## 2023-01-24 MED ORDER — METOPROLOL SUCCINATE ER 50 MG PO TB24: 50.0000 mg | ORAL_TABLET | Freq: Two times a day (BID) | ORAL | 3 refills | Status: DC

## 2023-01-24 NOTE — Patient Instructions (Signed)
Medication Instructions:  Your physician recommends that you continue on your current medications as directed. Please refer to the Current Medication list given to you today.  *If you need a refill on your cardiac medications before your next appointment, please call your pharmacy*  Lab Work: None ordered If you have labs (blood work) drawn today and your tests are completely normal, you will receive your results only by: MyChart Message (if you have MyChart) OR A paper copy in the mail If you have any lab test that is abnormal or we need to change your treatment, we will call you to review the results.   Testing/Procedures: Your physician has requested that you have an exercise tolerance test. For further information please visit [email or web address] Please also follow instruction sheet, as given.  Follow-Up: At [facility], you and your health needs are our priority.  As part of our continuing mission to provide you with exceptional heart care, we have created designated Provider Care Teams.  These Care Teams include your primary Cardiologist (physician) and Advanced Practice Providers (APPs -  Physician Assistants and Nurse Practitioners) who all work together to provide you with the care you need, when you need it.  Your next appointment:   3-6 month(s)  Provider:   Steffanie Dunn, MD  to discuss ablation

## 2023-01-28 ENCOUNTER — Telehealth (INDEPENDENT_AMBULATORY_CARE_PROVIDER_SITE_OTHER): Payer: Self-pay | Admitting: Primary Care

## 2023-01-28 NOTE — Telephone Encounter (Signed)
Spoke to pt and she will be ato apt on 9/24

## 2023-01-29 ENCOUNTER — Other Ambulatory Visit: Payer: Self-pay

## 2023-01-29 ENCOUNTER — Ambulatory Visit (INDEPENDENT_AMBULATORY_CARE_PROVIDER_SITE_OTHER): Payer: Medicaid Other | Admitting: Primary Care

## 2023-01-29 ENCOUNTER — Encounter (INDEPENDENT_AMBULATORY_CARE_PROVIDER_SITE_OTHER): Payer: Self-pay | Admitting: Primary Care

## 2023-01-29 VITALS — BP 138/78 | HR 58 | Resp 16 | Ht 64.0 in | Wt 168.6 lb

## 2023-01-29 DIAGNOSIS — Z7984 Long term (current) use of oral hypoglycemic drugs: Secondary | ICD-10-CM

## 2023-01-29 DIAGNOSIS — E663 Overweight: Secondary | ICD-10-CM

## 2023-01-29 DIAGNOSIS — Z7689 Persons encountering health services in other specified circumstances: Secondary | ICD-10-CM

## 2023-01-29 DIAGNOSIS — Z09 Encounter for follow-up examination after completed treatment for conditions other than malignant neoplasm: Secondary | ICD-10-CM

## 2023-01-29 DIAGNOSIS — I471 Supraventricular tachycardia, unspecified: Secondary | ICD-10-CM

## 2023-01-29 DIAGNOSIS — E119 Type 2 diabetes mellitus without complications: Secondary | ICD-10-CM | POA: Diagnosis not present

## 2023-01-29 MED ORDER — SITAGLIPTIN PHOSPHATE 25 MG PO TABS
25.0000 mg | ORAL_TABLET | Freq: Every day | ORAL | 1 refills | Status: DC
Start: 2023-01-29 — End: 2024-01-02

## 2023-01-29 MED ORDER — METFORMIN HCL 1000 MG PO TABS
1000.0000 mg | ORAL_TABLET | Freq: Two times a day (BID) | ORAL | 1 refills | Status: DC
Start: 2023-01-29 — End: 2023-02-21

## 2023-01-29 MED ORDER — ACCU-CHEK SOFTCLIX LANCETS MISC
12 refills | Status: AC
Start: 2023-01-29 — End: ?

## 2023-01-29 MED ORDER — ACCU-CHEK GUIDE VI STRP
ORAL_STRIP | 12 refills | Status: AC
Start: 2023-01-29 — End: ?

## 2023-01-29 NOTE — Progress Notes (Signed)
[facility]   Subjective:   Megan Austin is a 52 y.o. female presents for hospital follow up completed by cardiology and establish care with.PCP. Patient has No headache, No chest pain, No abdominal pain - No Nausea, No new weakness tingling or numbness, No Cough - shortness of breath.  She has no complaints or concerns at this time Past Medical History:  Diagnosis Date   Allergy    Arthritis    Diabetes mellitus without complication (HCC)    gestional   Hx of Bell's palsy 2013   left eye- remains blurred   SVT (supraventricular tachycardia)      Allergies  Allergen Reactions   Amoxicillin Hives   Ceclor [Cefaclor] Hives   Keflex [Cephalexin] Hives   Lorabid [Loracarbef] Hives   Septra [Sulfamethoxazole-Trimethoprim] Hives      Current Outpatient Medications on File Prior to Visit  Medication Sig Dispense Refill   Accu-Chek Softclix Lancets lancets 1 each in the morning, at noon, and at bedtime 100 each 0   acetaminophen (TYLENOL) 325 MG tablet Take 2 tablets (650 mg total) by mouth every 6 (six) hours as needed for moderate pain.     Blood Glucose Monitoring Suppl (BLOOD GLUCOSE MONITOR SYSTEM) w/Device KIT 1 each by Does not apply route in the morning, at noon, and at bedtime. 1 kit 0   flecainide (TAMBOCOR) 100 MG tablet Take 1 tablet (100 mg total) by mouth every 12 (twelve) hours. 180 tablet 3   Glucose Blood (BLOOD GLUCOSE TEST STRIPS) STRP Use as directed in the morning, at noon, and at bedtime. 100 strip 0   Lancet Device MISC 1 each by Does not apply route in the morning, at noon, and at bedtime. May substitute to any manufacturer covered by patient's insurance. 1 each 0   linagliptin (TRADJENTA) 5 MG TABS tablet Take 1 tablet (5 mg total) by mouth daily. (Patient not taking: Reported on 01/24/2023) 30 tablet 0   metoprolol succinate (TOPROL-XL) 50 MG 24 hr tablet Take 1 tablet (50 mg total) by mouth 2 (two) times daily. Take with or immediately following a  meal. 180 tablet 3   No current facility-administered medications on file prior to visit.     Review of System: Comprehensive ROS Pertinent positive and negative noted in HPI   Objective:  LMP 08/31/2019  BP (!) 154/92 (BP Location: Left Arm, Patient Position: Sitting, Cuff Size: Normal)   Pulse (!) 58   Resp 16   Ht 5\' 4"  (1.626 m)   Wt 168 lb 9.6 oz (76.5 kg)   LMP 08/31/2019   SpO2 100%   BMI 28.94 kg/m    Physical Exam: General Appearance: Well nourished, in no apparent distress. Eyes: PERRLA, EOMs, conjunctiva no swelling or erythema Sinuses: No Frontal/maxillary tenderness ENT/Mouth: Ext aud canals clear, TMs without erythema, bulging. Hearing normal.  Neck: Supple, thyroid normal.  Respiratory: Respiratory effort normal, BS equal bilaterally without rales, rhonchi, wheezing or stridor.  Cardio: RRR with no MRGs. Brisk peripheral pulses without edema.  Abdomen: Soft, + BS.  Non tender, no guarding, rebound, hernias, masses. Lymphatics: Non tender without lymphadenopathy.  Musculoskeletal: Full ROM, 5/5 strength, normal gait.  Skin: Warm, dry without rashes, lesions, ecchymosis.  Neuro: Cranial nerves intact. Normal muscle tone, no cerebellar symptoms. Sensation intact.  Psych: Awake and oriented X 3, normal affect, Insight and Judgment appropriate.    Assessment:  [name] was seen today for hospitalization follow-up.  Diagnoses and all orders for this visit:  Encounter to establish care  Hospital discharge follow-up  Completed by cardiology   Type 2 diabetes mellitus without complication, without long-term current use of insulin (HCC)  Complications from uncontrolled diabetes -diabetic retinopathy leading to blindness, diabetic nephropathy leading to dialysis, decrease in circulation decrease in sores or wound healing which may lead to amputations and increase of heart attack and stroke  -     metFORMIN (GLUCOPHAGE) 1000 MG tablet; Take 1 tablet (1,000 mg total)  by mouth 2 (two) times daily with a meal. -     sitaGLIPtin (JANUVIA) 25 MG tablet; Take 1 tablet (25 mg total) by mouth daily.  Over weight Discussed diet and exercise for person with BMI >25. Instructed: You must burn more calories than you eat. Losing 5 percent of your body weight should be considered a success. In the longer term, losing more than 15 percent of your body weight and staying at this weight is an extremely good result. However, keep in mind that even losing 5 percent of your body weight leads to important health benefits, so try not to get discouraged if you're not able to lose more than this. Will recheck weight in 3-6 months.     This note has been created with Education officer, environmental. Any transcriptional errors are unintentional.   Grayce Sessions, NP 01/29/2023, 4:14 PM

## 2023-01-30 ENCOUNTER — Telehealth: Payer: Self-pay | Admitting: Pulmonary Disease

## 2023-01-30 ENCOUNTER — Other Ambulatory Visit: Payer: Self-pay | Admitting: Pharmacist

## 2023-01-30 DIAGNOSIS — E119 Type 2 diabetes mellitus without complications: Secondary | ICD-10-CM

## 2023-01-30 MED ORDER — FREESTYLE LIBRE 3 SENSOR MISC
6 refills | Status: DC
Start: 2023-01-30 — End: 2024-01-02

## 2023-01-30 MED ORDER — FREESTYLE LIBRE 3 READER DEVI
0 refills | Status: DC
Start: 2023-01-30 — End: 2024-01-02

## 2023-01-30 NOTE — Telephone Encounter (Signed)
I spoke with patient and told her she did not need to hold any heart medications prior to GXT.

## 2023-01-30 NOTE — Telephone Encounter (Signed)
Pt c/o medication issue:  1. Name of Medication:  metoprolol succinate (TOPROL-XL) 50 MG 24 hr tablet  2. How are you currently taking this medication (dosage and times per day)?   3. Are you having a reaction (difficulty breathing--STAT)?   4. What is your medication issue?   Patient states she was advised not to take Meoprolol prior to stress test tomorrow, but she has concerns because she states she has been advised to never go without taking it.

## 2023-01-31 ENCOUNTER — Ambulatory Visit: Payer: Medicaid Other | Attending: Pulmonary Disease

## 2023-01-31 DIAGNOSIS — Z5181 Encounter for therapeutic drug level monitoring: Secondary | ICD-10-CM | POA: Diagnosis not present

## 2023-01-31 DIAGNOSIS — I471 Supraventricular tachycardia, unspecified: Secondary | ICD-10-CM | POA: Diagnosis not present

## 2023-01-31 LAB — EXERCISE TOLERANCE TEST
Angina Index: 0
Duke Treadmill Score: 5
Estimated workload: 6.4
Exercise duration (min): 4 min
Exercise duration (sec): 31 s
MPHR: 166 {beats}/min
Peak HR: 93 {beats}/min
Percent HR: 56 %
RPE: 17
Rest HR: 63 {beats}/min
ST Depression (mm): 0 mm

## 2023-02-01 ENCOUNTER — Other Ambulatory Visit: Payer: Self-pay

## 2023-02-01 ENCOUNTER — Telehealth: Payer: Self-pay

## 2023-02-04 ENCOUNTER — Other Ambulatory Visit: Payer: Self-pay

## 2023-02-07 ENCOUNTER — Encounter: Payer: Self-pay | Admitting: Cardiology

## 2023-02-07 NOTE — Telephone Encounter (Signed)
CREATED IN ERROR

## 2023-02-21 ENCOUNTER — Other Ambulatory Visit (INDEPENDENT_AMBULATORY_CARE_PROVIDER_SITE_OTHER): Payer: Self-pay | Admitting: Primary Care

## 2023-02-21 DIAGNOSIS — E119 Type 2 diabetes mellitus without complications: Secondary | ICD-10-CM

## 2023-02-21 NOTE — Telephone Encounter (Signed)
Requested Prescriptions  Pending Prescriptions Disp Refills   metFORMIN (GLUCOPHAGE) 1000 MG tablet [Pharmacy Med Name: METFORMIN HCL 1,000 MG TABLET] 180 tablet 0    Sig: TAKE 1 TABLET (1,000 MG TOTAL) BY MOUTH TWICE A DAY WITH FOOD     Endocrinology:  Diabetes - Biguanides Failed - 02/21/2023  2:31 PM      Failed - HBA1C is between 0 and 7.9 and within 180 days    Hgb A1c MFr Bld  Date Value Ref Range Status  12/31/2022 9.5 (H) 4.8 - 5.6 % Final    Comment:    (NOTE) Pre diabetes:          5.7%-6.4%  Diabetes:              >6.4%  Glycemic control for   <7.0% adults with diabetes          Failed - B12 Level in normal range and within 720 days    No results found for: "VITAMINB12"       Passed - Cr in normal range and within 360 days    Creatinine, Ser  Date Value Ref Range Status  01/01/2023 0.94 0.44 - 1.00 mg/dL Final         Passed - eGFR in normal range and within 360 days    GFR calc Af Amer  Date Value Ref Range Status  10/24/2019 >60 >60 mL/min Final   GFR, Estimated  Date Value Ref Range Status  01/01/2023 >60 >60 mL/min Final    Comment:    (NOTE) Calculated using the CKD-EPI Creatinine Equation (2021)          Passed - Valid encounter within last 6 months    Recent Outpatient Visits           3 weeks ago Encounter to establish care   [facility] Grayce Sessions, NP   6 years ago Influenza-like illness   [facility], Marolyn Hammock, PA-C   7 years ago Dysuria   [facility] Care at [facility], Michel Harrow, MD   7 years ago Upper respiratory infection   [facility] Care at [facility], [place], [place]   8 years ago Cough   [facility] at [facility], Mitchel Honour, DO       Future Appointments             In 2 months Randa Evens, Kinnie Scales, NP [facility]   In 3 months Lanier Prude, MD [facility]            Passed - CBC within normal  limits and completed in the last 12 months    WBC  Date Value Ref Range Status  01/01/2023 10.6 (H) 4.0 - 10.5 K/uL Final   RBC  Date Value Ref Range Status  01/01/2023 5.04 3.87 - 5.11 MIL/uL Final   Hemoglobin  Date Value Ref Range Status  01/01/2023 15.4 (H) 12.0 - 15.0 g/dL Final   HCT  Date Value Ref Range Status  01/01/2023 45.9 36.0 - 46.0 % Final   MCHC  Date Value Ref Range Status  01/01/2023 33.6 30.0 - 36.0 g/dL Final   [facility]  Date Value Ref Range Status  01/01/2023 30.6 26.0 - 34.0 pg Final   MCV  Date Value Ref Range Status  01/01/2023 91.1 80.0 - 100.0 fL Final  05/27/2013 97.7 (A) 80 - 97 fL Final   Platelet Count, POC  Date Value Ref Range Status  05/27/2013 331 142 - 424 K/uL Final   RDW  Date Value Ref Range Status  01/01/2023 12.6 11.5 - 15.5 % Final   RDW, POC  Date Value Ref Range Status  05/27/2013 14.1 % Final

## 2023-03-04 DIAGNOSIS — E119 Type 2 diabetes mellitus without complications: Secondary | ICD-10-CM | POA: Diagnosis not present

## 2023-03-04 DIAGNOSIS — H52203 Unspecified astigmatism, bilateral: Secondary | ICD-10-CM | POA: Diagnosis not present

## 2023-03-04 DIAGNOSIS — H524 Presbyopia: Secondary | ICD-10-CM | POA: Diagnosis not present

## 2023-03-04 DIAGNOSIS — H5213 Myopia, bilateral: Secondary | ICD-10-CM | POA: Diagnosis not present

## 2023-03-04 DIAGNOSIS — H0102A Squamous blepharitis right eye, upper and lower eyelids: Secondary | ICD-10-CM | POA: Diagnosis not present

## 2023-03-04 DIAGNOSIS — H2513 Age-related nuclear cataract, bilateral: Secondary | ICD-10-CM | POA: Diagnosis not present

## 2023-03-04 DIAGNOSIS — H0102B Squamous blepharitis left eye, upper and lower eyelids: Secondary | ICD-10-CM | POA: Diagnosis not present

## 2023-03-04 DIAGNOSIS — H5211 Myopia, right eye: Secondary | ICD-10-CM | POA: Diagnosis not present

## 2023-04-16 DIAGNOSIS — H524 Presbyopia: Secondary | ICD-10-CM | POA: Diagnosis not present

## 2023-04-29 ENCOUNTER — Telehealth (INDEPENDENT_AMBULATORY_CARE_PROVIDER_SITE_OTHER): Payer: Self-pay | Admitting: Primary Care

## 2023-04-29 ENCOUNTER — Ambulatory Visit (INDEPENDENT_AMBULATORY_CARE_PROVIDER_SITE_OTHER): Payer: Medicaid Other | Admitting: Primary Care

## 2023-04-29 NOTE — Telephone Encounter (Signed)
Called to reschedule apt. Apt was rescheduled do to provider not being in office this afternoon.

## 2023-05-02 ENCOUNTER — Ambulatory Visit (INDEPENDENT_AMBULATORY_CARE_PROVIDER_SITE_OTHER): Payer: Medicaid Other | Admitting: Primary Care

## 2023-05-22 ENCOUNTER — Other Ambulatory Visit: Payer: Self-pay

## 2023-05-22 ENCOUNTER — Ambulatory Visit: Payer: Medicaid Other | Attending: Cardiology | Admitting: Cardiology

## 2023-05-22 ENCOUNTER — Encounter: Payer: Self-pay | Admitting: Cardiology

## 2023-05-22 VITALS — BP 130/94 | HR 60 | Ht 64.0 in | Wt 180.0 lb

## 2023-05-22 DIAGNOSIS — Z5181 Encounter for therapeutic drug level monitoring: Secondary | ICD-10-CM | POA: Diagnosis not present

## 2023-05-22 DIAGNOSIS — I471 Supraventricular tachycardia, unspecified: Secondary | ICD-10-CM

## 2023-05-22 DIAGNOSIS — Z79899 Other long term (current) drug therapy: Secondary | ICD-10-CM | POA: Diagnosis not present

## 2023-05-22 NOTE — Patient Instructions (Signed)
 Medication Instructions:  Your physician recommends that you continue on your current medications as directed. Please refer to the Current Medication list given to you today.  *If you need a refill on your cardiac medications before your next appointment, please call your pharmacy*  Follow-Up: At [facility], you and your health needs are our priority.  As part of our continuing mission to provide you with exceptional heart care, we have created designated Provider Care Teams.  These Care Teams include your primary Cardiologist (physician) and Advanced Practice Providers (APPs -  Physician Assistants and Nurse Practitioners) who all work together to provide you with the care you need, when you need it.   Your next appointment:   3 months  Provider:   You will see one of the following Advanced Practice Providers on your designated Care Team:   Francis Dowse, Charlott Holler [address]" [place], [place] Sherie Don, NP Canary Brim, NP

## 2023-05-22 NOTE — Progress Notes (Signed)
  Electrophysiology Office Follow up Visit Note:    Date:  05/22/2023   ID:  Megan Austin, DOB 1970-06-10, MRN [id number]  PCP:  Pcp, No  [facility] Cardiologist:  None  [facility] Electrophysiologist:  Boyce Byes, MD    Interval History:     Megan Austin is a 53 y.o. female who presents for a follow up visit.  I last saw her when she was hospitalized in August 2024 for SVT. 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  She was started on metoprolol .  Given breakthrough episodes flecainide  was added.  She has done quite well on flecainide  and metoprolol  without sustained recurrence of arrhythmia.  Complicating her management has been untreated diabetes.  Multiple medications have been recommended but the patient has not taken them, instead pursuing dietary changes.  She reports morning glucose levels greater than 200.        Past medical, surgical, social and family history were reviewed.  ROS:   Please see the history of present illness.    All other systems reviewed and are negative.  EKGs/Labs/Other Studies Reviewed:    The following studies were reviewed today:     EKG Interpretation Date/Time:  Wednesday May 22 2023 16:26:59 EST Ventricular Rate:  60 PR Interval:  170 QRS Duration:  102 QT Interval:  454 QTC Calculation: 454 R Axis:   29  Text Interpretation: Normal sinus rhythm Normal ECG Confirmed by Harvie Liner [phone]) on 05/22/2023 8:08:49 PM    Physical Exam:    VS:  BP (!) 130/94 (BP Location: Left Arm, Patient Position: Sitting, Cuff Size: Normal)   Pulse 60   Ht 5\' 4"  (1.626 m)   Wt 180 lb (81.6 kg)   LMP 08/31/2019   SpO2 98%   BMI 30.90 kg/m     Wt Readings from Last 3 Encounters:  05/22/23 180 lb (81.6 kg)  01/29/23 168 lb 9.6 oz (76.5 kg)  01/24/23 167 lb 12.8 oz (76.1 kg)     GEN: no distress CARD: RRR, No MRG RESP: No IWOB. CTAB.      ASSESSMENT:    1. Paroxysmal SVT  (supraventricular tachycardia) (HCC)   2. Medication monitoring encounter   3. Encounter for long-term (current) use of high-risk medication    PLAN:    In order of problems listed above:  #SVT #High risk med monitoring-flecainide  Quiescent on flecainide  and metoprolol .  We again discussed treatment options for her SVT including continued antiarrhythmic use, stopping antiarrhythmics and monitoring for recurrence or pursuing EP study with ablation.  Given her untreated diabetes, I do not think she is a good candidate for EP study and ablation.  If she can get her diabetes under control, can revisit this question in an effort to avoid long-term use of flecainide .  For now, she will continue flecainide  and metoprolol .  EKG today shows PR and QRS durations acceptable for ongoing flecainide  use.  #Diabetes Untreated.  I have encouraged her to establish care with a primary care physician to get this treated.  I offered to put a referral in for primary care physician.  She said she will call her insurance company/Medicaid to get a new PCP. Check hemoglobin A1c today   Follow-up 3 months with APP.   Signed, Harvie Liner, MD, [facility], [facility] 05/22/2023 8:12 PM    Electrophysiology [facility]

## 2023-05-23 DIAGNOSIS — I471 Supraventricular tachycardia, unspecified: Secondary | ICD-10-CM | POA: Diagnosis not present

## 2023-05-23 DIAGNOSIS — Z5181 Encounter for therapeutic drug level monitoring: Secondary | ICD-10-CM | POA: Diagnosis not present

## 2023-05-23 DIAGNOSIS — E119 Type 2 diabetes mellitus without complications: Secondary | ICD-10-CM | POA: Diagnosis not present

## 2023-05-24 LAB — HEMOGLOBIN A1C
Est. average glucose Bld gHb Est-mCnc: 246 mg/dL
Hgb A1c MFr Bld: 10.2 % — ABNORMAL HIGH (ref 4.8–5.6)

## 2023-05-26 ENCOUNTER — Encounter: Payer: Self-pay | Admitting: Cardiology

## 2023-08-27 ENCOUNTER — Ambulatory Visit: Payer: Medicaid Other | Admitting: Physician Assistant

## 2024-01-02 ENCOUNTER — Ambulatory Visit: Attending: Cardiology | Admitting: Pulmonary Disease

## 2024-01-02 ENCOUNTER — Encounter: Payer: Self-pay | Admitting: Pulmonary Disease

## 2024-01-02 VITALS — BP 130/85 | HR 63 | Ht 64.0 in | Wt 173.0 lb

## 2024-01-02 DIAGNOSIS — I471 Supraventricular tachycardia, unspecified: Secondary | ICD-10-CM | POA: Diagnosis not present

## 2024-01-02 DIAGNOSIS — E1165 Type 2 diabetes mellitus with hyperglycemia: Secondary | ICD-10-CM | POA: Diagnosis not present

## 2024-01-02 DIAGNOSIS — Z79899 Other long term (current) drug therapy: Secondary | ICD-10-CM | POA: Diagnosis not present

## 2024-01-02 MED ORDER — METOPROLOL SUCCINATE ER 50 MG PO TB24: 50.0000 mg | ORAL_TABLET | Freq: Two times a day (BID) | ORAL | 3 refills | Status: AC

## 2024-01-02 MED ORDER — FLECAINIDE ACETATE 100 MG PO TABS: 100.0000 mg | ORAL_TABLET | Freq: Two times a day (BID) | ORAL | 3 refills | Status: AC

## 2024-01-02 NOTE — Progress Notes (Signed)
  Electrophysiology Office Note:   Date:  01/02/2024  ID:  Megan Austin, DOB 1970-11-18, MRN [id number]  Primary Cardiologist: None Primary Heart Failure: None Electrophysiologist: OLE ONEIDA HOLTS, MD      History of Present Illness:   Megan Austin is a 53 y.o. female with h/o palpitations due to SVT, DM II  seen today for routine electrophysiology followup.   Since last being seen in our clinic the patient reports doing well. She has not had any issues on flecainide  + Toprol . No further episodes of SVT.  She would like to not have to be on the medications. She is open to the idea of seeing a primary care to work on her DM.  She has been hesitant in the past.  She understands that there are long term consequences to uncontrolled DM.    She denies chest pain, palpitations, dyspnea, PND, orthopnea, nausea, vomiting, dizziness, syncope, edema, weight gain, or early satiety.   Review of systems complete and found to be negative unless listed in HPI.   EP Information / Studies Reviewed:    EKG is ordered today. Personal review as below.  EKG Interpretation Date/Time:  Thursday January 02 2024 15:29:08 EDT Ventricular Rate:  63 PR Interval:  174 QRS Duration:  102 QT Interval:  448 QTC Calculation: 458 R Axis:   -10  Text Interpretation: Normal sinus rhythm Confirmed by Aniceto Jarvis ([id number]) on 01/02/2024 3:46:20 PM    Arrhythmia / AAD / Pertinent EP Studies SVT > dx 12/2022  Flecainide  + Lopressor    Risk Assessment/Calculations:              Physical Exam:   VS:  BP 130/85   Pulse 63   Ht 5' 4 (1.626 m)   Wt 173 lb (78.5 kg)   LMP 08/31/2019   SpO2 96%   BMI 29.70 kg/m    Wt Readings from Last 3 Encounters:  01/02/24 173 lb (78.5 kg)  05/22/23 180 lb (81.6 kg)  01/29/23 168 lb 9.6 oz (76.5 kg)     GEN: Well nourished, well developed in no acute distress NECK: No JVD; No carotid bruits CARDIAC: Regular rate and rhythm, no murmurs, rubs, gallops RESPIRATORY:  Clear to  auscultation without rales, wheezing or rhonchi  ABDOMEN: Soft, non-tender, non-distended EXTREMITIES:  No edema; No deformity   ASSESSMENT AND PLAN:    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 HOLTS / EP APP 4 months for flecainide  surveillance    Signed, Jarvis Aniceto, NP-C, AGACNP-BC [facility] - Electrophysiology  01/02/2024, 4:42 PM

## 2024-01-02 NOTE — Patient Instructions (Addendum)
 Medication Instructions:  Your physician recommends that you continue on your current medications as directed. Please refer to the Current Medication list given to you today.  *If you need a refill on your cardiac medications before your next appointment, please call your pharmacy*  Lab Work: None ordered If you have labs (blood work) drawn today and your tests are completely normal, you will receive your results only by: MyChart Message (if you have MyChart) OR A paper copy in the mail If you have any lab test that is abnormal or we need to change your treatment, we will call you to review the results.  Follow-Up: At [facility], you and your health needs are our priority.  As part of our continuing mission to provide you with exceptional heart care, our providers are all part of one team.  This team includes your primary Cardiologist (physician) and Advanced Practice Providers or APPs (Physician Assistants and Nurse Practitioners) who all work together to provide you with the care you need, when you need it.  Your next appointment:   4 month(s)  Provider:   Ole Holts, MD or Daphne Barrack, NP    You have been referred to see primary care. You will receive a call to schedule an appointment.

## 2024-05-03 NOTE — Progress Notes (Deleted)
" °  Electrophysiology Office Note:   Date:  05/03/2024  ID:  Megan Austin, DOB October 08, 1970, MRN [id number]  Primary Cardiologist: None Primary Heart Failure: None Electrophysiologist: OLE ONEIDA HOLTS, MD  {Click to update primary MD,subspecialty MD or APP then REFRESH:1}    History of Present Illness:   Megan Austin is a 53 y.o. female with h/o palpitations due to SVT, DM II seen today for routine electrophysiology followup.   Since last being seen in our clinic the patient reports doing ***.    She ***denies chest pain, palpitations, dyspnea, PND, orthopnea, nausea, vomiting, dizziness, syncope, edema, weight gain, or early satiety.   Review of systems complete and found to be negative unless listed in HPI.   EP Information / Studies Reviewed:    EKG is ordered today. Personal review as below.      Arrhythmia / AAD / Pertinent EP Studies SVT > dx 12/2022  Flecainide  + Lopressor    Risk Assessment/Calculations:     No BP recorded.  {Refresh Note OR Click here to enter BP  :1}***        Physical Exam:   VS:  LMP 08/31/2019    Wt Readings from Last 3 Encounters:  01/02/24 173 lb (78.5 kg)  05/22/23 180 lb (81.6 kg)  01/29/23 168 lb 9.6 oz (76.5 kg)     GEN: Well nourished, well developed in no acute distress NECK: No JVD; No carotid bruits CARDIAC: {EPRHYTHM:28826}, no murmurs, rubs, gallops RESPIRATORY:  Clear to auscultation without rales, wheezing or rhonchi  ABDOMEN: Soft, non-tender, non-distended EXTREMITIES:  No edema; No deformity   ASSESSMENT AND PLAN:    Paroxysmal SVT  Palpitations  High Risk Medication Monitoring: Flecainide   -EKG with ***  -continue flecainide  100 mg BID  -metoprolol  50 mg BID  -not a candidate for ablation given uncontrolled DM   Untreated / Poorly Controlled DM  -previously referred to [facility] for PCP  -most recent A1c 9.5 12/2022  Follow up with EP APP {EPFOLLOW LE:71826}  Signed, Daphne Barrack, NP-C,  AGACNP-BC [facility] - Electrophysiology  05/03/2024, 11:38 AM  "

## 2024-05-04 ENCOUNTER — Ambulatory Visit: Admitting: Pulmonary Disease

## 2024-05-04 DIAGNOSIS — I471 Supraventricular tachycardia, unspecified: Secondary | ICD-10-CM

## 2024-05-04 DIAGNOSIS — E1165 Type 2 diabetes mellitus with hyperglycemia: Secondary | ICD-10-CM

## 2024-05-04 DIAGNOSIS — Z79899 Other long term (current) drug therapy: Secondary | ICD-10-CM

## 2024-06-25 ENCOUNTER — Ambulatory Visit: Admitting: Pulmonary Disease
